# Patient Record
Sex: Female | Born: 1995 | Hispanic: Yes | Marital: Married | Smoking: Never smoker
Health system: Southern US, Community
[De-identification: ages and names within clinical notes are randomized; demographics above are authoritative.]

## PROBLEM LIST (undated history)

## (undated) DIAGNOSIS — Z789 Other specified health status: Secondary | ICD-10-CM

## (undated) DIAGNOSIS — K4090 Unilateral inguinal hernia, without obstruction or gangrene, not specified as recurrent: Secondary | ICD-10-CM

## (undated) HISTORY — DX: Other specified health status: Z78.9

## (undated) HISTORY — PX: NO PAST SURGERIES: SHX2092

---

## 2019-10-22 NOTE — L&D Delivery Note (Addendum)
Delivery Note CNM asked to come to BS due to imminent delivery. Baby well-dated by first trimester ultrasound. 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 At 2:29 PM a severely preterm infant was delivered via Vaginal, Spontaneous (Presentation: Double footling breech). Slow heart rate present at delivery. APGAR: 1, 1; weight pending.   Placenta status: Spontaneous, no trailing membranes, in very poor condition. Sent Pathology. Cord: 3 vessels with the following complications: none.  Cord pH: NA  Anesthesia: None Episiotomy: None Lacerations: None Suture Repair: NA Est. Blood Loss (mL): 151 ml   Mom to [facility].  Baby to stay in room for now. Dr. Debroah Loop notified of delivery.  FOB requesting return of Neonatologist who had stepped into the room as baby was delivering and left due to know previability. Dr. Theresa Mulligan called and came back to talk to family. FOB expressed gratitude for care and explanations of why baby was not a candidate for resuscitation.  Dorathy Kinsman 03/24/2020, 3:09 PM

## 2020-01-27 ENCOUNTER — Inpatient Hospital Stay (HOSPITAL_COMMUNITY)
Admission: EM | Admit: 2020-01-27 | Discharge: 2020-01-27 | Disposition: A | Discharge status: Home / Self Care | Payer: Medicaid Other | Attending: Obstetrics and Gynecology | Admitting: Obstetrics and Gynecology

## 2020-01-27 ENCOUNTER — Other Ambulatory Visit: Payer: Self-pay

## 2020-01-27 ENCOUNTER — Ambulatory Visit (HOSPITAL_COMMUNITY)
Admission: EM | Admit: 2020-01-27 | Discharge: 2020-01-27 | Disposition: A | Discharge status: Home / Self Care | Payer: Self-pay | Attending: Family Medicine | Admitting: Family Medicine

## 2020-01-27 ENCOUNTER — Inpatient Hospital Stay (HOSPITAL_COMMUNITY): Payer: Medicaid Other

## 2020-01-27 ENCOUNTER — Encounter (HOSPITAL_COMMUNITY): Payer: Self-pay

## 2020-01-27 DIAGNOSIS — Z3A13 13 weeks gestation of pregnancy: Secondary | ICD-10-CM | POA: Diagnosis not present

## 2020-01-27 DIAGNOSIS — O200 Threatened abortion: Secondary | ICD-10-CM | POA: Insufficient documentation

## 2020-01-27 DIAGNOSIS — O3680X0 Pregnancy with inconclusive fetal viability, not applicable or unspecified: Secondary | ICD-10-CM

## 2020-01-27 LAB — URINALYSIS, ROUTINE W REFLEX MICROSCOPIC
Bilirubin Urine: NEGATIVE
Glucose, UA: NEGATIVE mg/dL
Hgb urine dipstick: NEGATIVE
Ketones, ur: NEGATIVE mg/dL
Leukocytes,Ua: NEGATIVE
Nitrite: NEGATIVE
Protein, ur: NEGATIVE mg/dL
Specific Gravity, Urine: 1.015 (ref 1.005–1.030)
pH: 7 (ref 5.0–8.0)

## 2020-01-27 LAB — I-STAT BETA HCG BLOOD, ED (MC, WL, AP ONLY): I-stat hCG, quantitative: >2000 m[IU]/mL — ABNORMAL HIGH (ref <5)

## 2020-01-27 LAB — WET PREP, GENITAL
Clue Cells Wet Prep HPF POC: NONE SEEN
Sperm: NONE SEEN
Trich, Wet Prep: NONE SEEN
Yeast Wet Prep HPF POC: NONE SEEN

## 2020-01-27 LAB — POC URINE PREG, ED: Preg Test, Ur: NEGATIVE

## 2020-01-27 NOTE — MAU Provider Note (Signed)
Chief Complaint: Rupture of Membranes   None     SUBJECTIVE HPI: Latoya Taylor is a 24 y.o. G1P0 at [redacted]w[redacted]d by LMP who presents to maternity admissions reporting gush of clear fluid this morning at 5 am following by a light trickle of fluid since then. There is no pain, no bleeding, no fever/chills.  There is no other discharge or itching/burning/odor.  She has not tried any treatments.   HPI  History reviewed. No pertinent past medical history. History reviewed. No pertinent surgical history. Social History   Socioeconomic History  . Marital status: Married    Spouse name: Not on file  . Number of children: Not on file  . Years of education: Not on file  . Highest education level: Not on file  Occupational History  . Not on file  Tobacco Use  . Smoking status: Never Smoker  Substance and Sexual Activity  . Alcohol use: Never  . Drug use: Never  . Sexual activity: Yes  Other Topics Concern  . Not on file  Social History Narrative  . Not on file   Social Determinants of Health   Financial Resource Strain:   . Difficulty of Paying Living Expenses:   Food Insecurity:   . Worried About Charity fundraiser in the Last Year:   . Arboriculturist in the Last Year:   Transportation Needs:   . Film/video editor (Medical):   Marland Kitchen Lack of Transportation (Non-Medical):   Physical Activity:   . Days of Exercise per Week:   . Minutes of Exercise per Session:   Stress:   . Feeling of Stress :   Social Connections:   . Frequency of Communication with Friends and Family:   . Frequency of Social Gatherings with Friends and Family:   . Attends Religious Services:   . Active Member of Clubs or Organizations:   . Attends Archivist Meetings:   Marland Kitchen Marital Status:   Intimate Partner Violence:   . Fear of Current or Ex-Partner:   . Emotionally Abused:   Marland Kitchen Physically Abused:   . Sexually Abused:    No current facility-administered medications on file prior to encounter.    Current Outpatient Medications on File Prior to Encounter  Medication Sig Dispense Refill  . Prenatal Vit-Fe Fumarate-FA (PRENATAL MULTIVITAMIN) TABS tablet Take 1 tablet by mouth daily at 12 noon.     No Known Allergies  ROS:  Review of Systems  Constitutional: Negative for chills and fever.  Respiratory: Negative for cough and shortness of breath.   Cardiovascular: Negative for chest pain.  Gastrointestinal: Negative for nausea and vomiting.  Genitourinary: Positive for vaginal discharge. Negative for dysuria, frequency and urgency.  Musculoskeletal: Negative.   Neurological: Negative for dizziness and headaches.     I have reviewed patient's Past Medical Hx, Surgical Hx, Family Hx, Social Hx, medications and allergies.   Physical Exam   Patient Vitals for the past 24 hrs:  BP Temp Temp src Pulse Resp SpO2  01/27/20 1415 127/81 -- -- 98 17 --  01/27/20 1024 132/86 99 F (37.2 C) Oral (!) 101 18 --  01/27/20 0910 125/80 (!) 97.4 F (36.3 C) Oral 90 20 99 %   Constitutional: Well-developed, well-nourished female in no acute distress.  Cardiovascular: normal rate Respiratory: normal effort GI: Abd soft, non-tender. Pos BS x 4 MS: Extremities nontender, no edema, normal ROM Neurologic: Alert and oriented x 4.  GU: Neg CVAT.  PELVIC EXAM: Cervix pink, visually  closed, mildly friable with ectropion, small amount thin watery discharge, vaginal walls and external genitalia normal  FHT 180 by doppler  LAB RESULTS Results for orders placed or performed during the hospital encounter of 01/27/20 (from the past 24 hour(s))  I-Stat beta hCG blood, ED     Status: Abnormal   Collection Time: 01/27/20  9:35 AM  Result Value Ref Range   I-stat hCG, quantitative >2,000.0 (H) <5 mIU/mL   Comment 3          Urinalysis, Routine w reflex microscopic     Status: None   Collection Time: 01/27/20 10:50 AM  Result Value Ref Range   Color, Urine YELLOW YELLOW   APPearance CLEAR CLEAR    Specific Gravity, Urine 1.015 1.005 - 1.030   pH 7.0 5.0 - 8.0   Glucose, UA NEGATIVE NEGATIVE mg/dL   Hgb urine dipstick NEGATIVE NEGATIVE   Bilirubin Urine NEGATIVE NEGATIVE   Ketones, ur NEGATIVE NEGATIVE mg/dL   Protein, ur NEGATIVE NEGATIVE mg/dL   Nitrite NEGATIVE NEGATIVE   Leukocytes,Ua NEGATIVE NEGATIVE  Wet prep, genital     Status: Abnormal   Collection Time: 01/27/20 11:48 AM   Specimen: Vaginal; Genital  Result Value Ref Range   Yeast Wet Prep HPF POC NONE SEEN NONE SEEN   Trich, Wet Prep NONE SEEN NONE SEEN   Clue Cells Wet Prep HPF POC NONE SEEN NONE SEEN   WBC, Wet Prep HPF POC MANY (A) NONE SEEN   Sperm NONE SEEN        IMAGING US OB LESS THAN 14 WEEKS WITH OB TRANSVAGINAL  Result Date: 01/27/2020 CLINICAL DATA:  Leaking fluid. Evaluate viability. Quantitative beta HCG level greater than 2000. Patient 13 weeks and 6 days pregnant based on her last menstrual period. EXAM: OBSTETRIC <14 WK ULTRASOUND TECHNIQUE: Transabdominal ultrasound was performed for evaluation of the gestation as well as the maternal uterus and adnexal regions. COMPARISON:  None. FINDINGS: Intrauterine gestational sac: Single, but not well-defined due to adversely no amniotic fluid surround embryo. Yolk sac:  Not Visualized. Embryo:  Visualized. Cardiac Activity: Visualized. Heart Rate: 159 bpm CRL:   49.57 mm   11 w 5 d                  [place] EDC: 08/12/2020 Subchorionic hemorrhage:  None visualized. Maternal uterus/adnexae: Virtually no amniotic fluid detected surrounding the embryo. No uterine masses. Cervix is unremarkable. No adnexal mass.  Neither ovary visualized. IMPRESSION: 1. Marked paucity of amniotic fluid, with virtually no fluid seen surrounding the embryo. 2. Single live intrauterine pregnancy with a measured gestational age of [redacted] weeks and 5 days. Electronically Signed   By: Amie Portland M.D.   On: 01/27/2020 12:34    MAU Management/MDM: Orders Placed This Encounter  Procedures  .  Wet prep, genital  . US OB LESS THAN 14 WEEKS WITH OB TRANSVAGINAL  . [place] MFM Fetal Nuchal Translucency  . Urinalysis, Routine w reflex microscopic  . AMB referral to maternal fetal medicine  . I-Stat beta hCG blood, ED  . Fern Test  . Discharge patient    No orders of the defined types were placed in this encounter.   SSE with watery fluid but not enough for pooling.  Fern slide negative. [place] results today indicate low fluid, making PPROM at early gestational age likely.  Discussed results with pt, poor prognosis, pt will likely miscarry.  Answered pt and s/o questions today.  Consult Dr Vergie Living about follow up  plan.  Pt to MFM for NT ultrasound and consult tomorrow at 7:30 am for diagnosis and management plan.  Pt discharged with strict return precautions.  ASSESSMENT 1. Threatened miscarriage in early pregnancy   2. Pregnancy with uncertain fetal viability     PLAN Discharge home Allergies as of 01/27/2020   No Known Allergies     Medication List    TAKE these medications   prenatal multivitamin Tabs tablet Take 1 tablet by mouth daily at 12 noon.      Follow-up Information    [facility] Follow up.   Specialty: Maternal and Fetal Medicine Why: Ultrasound and counseling at 7:30 am tomorrow, 01/28/20.   Contact information: [address], [address] [id number] mc [place] [address] [phone]       [facility] Follow up.   Specialty: Obstetrics and Gynecology Why: Return to MAU as needed for emergencies. Contact information: [address] [id number] mc [place] [address] [phone]          Sharen Counter Certified Nurse-Midwife 01/27/2020  2:32 PM

## 2020-01-27 NOTE — MAU Note (Signed)
Patient woke up from sleep at 0500 with clear fluid leaking from her vagina. Then got up to use the bathroom and voided, it was still "leaking."  NO pain. No leaking now.

## 2020-01-27 NOTE — ED Provider Notes (Signed)
MSE was initiated and I personally evaluated the patient and placed orders (if any) at  9:18 AM on January 27, 2020.   24 year old female presents with complaint of leaking clear fluid- described as a gush while going to the bathroom, did not smell like urine, not currently needing a pad.  Patient reports she is [redacted] weeks pregnant, LMP October 22, 2019, [facility] to [facility] for positive pregnancy test and ultrasound 2 weeks ago.  Symptoms started this morning around 5 AM, reports mild abdominal cramping, none at this time, denies vaginal bleeding.  Patient is G1, P0. Denies abdominal pain, on exam, has mild generalized discomfort, states she just wasn't expecting deep palpation.   + quant, >2000 Discussed with Santina Evans, MAU APP, send to MAU for further evaluation.  The patient appears stable so that the remainder of the MSE may be completed by another provider.   Jeannie Fend, PA-C 01/27/20 1101    Rolan Bucco, MD 01/27/20 1323

## 2020-01-27 NOTE — ED Triage Notes (Signed)
Patient complains of vaginal discharge that she describes as fluid leaking. Reports positive pregnancy 1/1 and complains of general lower abdominal pain with same. G1

## 2020-01-27 NOTE — ED Notes (Signed)
Pt decided she wanted to go to womens since she could not get an ultrasound and OB care in urgent care.

## 2020-01-28 ENCOUNTER — Ambulatory Visit (HOSPITAL_COMMUNITY): Payer: Medicaid Other

## 2020-01-28 LAB — GC/CHLAMYDIA PROBE AMP (~~LOC~~) NOT AT ARMC
Chlamydia: NEGATIVE
Comment: NEGATIVE
Comment: NORMAL
Neisseria Gonorrhea: NEGATIVE

## 2020-02-03 ENCOUNTER — Other Ambulatory Visit (HOSPITAL_COMMUNITY): Payer: Self-pay | Admitting: *Deleted

## 2020-02-03 DIAGNOSIS — O3680X0 Pregnancy with inconclusive fetal viability, not applicable or unspecified: Secondary | ICD-10-CM

## 2020-02-04 ENCOUNTER — Encounter (HOSPITAL_COMMUNITY): Payer: Self-pay

## 2020-02-04 ENCOUNTER — Ambulatory Visit (HOSPITAL_COMMUNITY): Payer: Medicaid Other | Admitting: *Deleted

## 2020-02-04 ENCOUNTER — Ambulatory Visit (HOSPITAL_COMMUNITY)
Admission: RE | Admit: 2020-02-04 | Discharge: 2020-02-04 | Disposition: A | Discharge status: Home / Self Care | Payer: Medicaid Other | Source: Ambulatory Visit | Attending: Obstetrics and Gynecology | Admitting: Obstetrics and Gynecology

## 2020-02-04 ENCOUNTER — Other Ambulatory Visit: Payer: Self-pay

## 2020-02-04 VITALS — BP 129/92 | HR 117 | Temp 97.8°F | Ht <= 58 in

## 2020-02-04 DIAGNOSIS — Z3A15 15 weeks gestation of pregnancy: Secondary | ICD-10-CM

## 2020-02-04 DIAGNOSIS — O3680X0 Pregnancy with inconclusive fetal viability, not applicable or unspecified: Secondary | ICD-10-CM | POA: Diagnosis present

## 2020-02-07 ENCOUNTER — Other Ambulatory Visit (HOSPITAL_COMMUNITY): Payer: Self-pay | Admitting: *Deleted

## 2020-02-07 ENCOUNTER — Encounter: Payer: Self-pay | Admitting: Advanced Practice Midwife

## 2020-02-07 DIAGNOSIS — Z8759 Personal history of other complications of pregnancy, childbirth and the puerperium: Secondary | ICD-10-CM

## 2020-02-23 ENCOUNTER — Ambulatory Visit: Payer: Medicaid Other | Admitting: *Deleted

## 2020-02-23 ENCOUNTER — Ambulatory Visit (HOSPITAL_COMMUNITY): Payer: Medicaid Other | Attending: Obstetrics and Gynecology

## 2020-02-23 ENCOUNTER — Encounter: Payer: Self-pay | Admitting: *Deleted

## 2020-02-23 ENCOUNTER — Other Ambulatory Visit: Payer: Self-pay

## 2020-02-23 VITALS — BP 131/91 | HR 112 | Temp 97.7°F

## 2020-02-23 DIAGNOSIS — Z8759 Personal history of other complications of pregnancy, childbirth and the puerperium: Secondary | ICD-10-CM | POA: Diagnosis not present

## 2020-02-23 DIAGNOSIS — O321XX0 Maternal care for breech presentation, not applicable or unspecified: Secondary | ICD-10-CM

## 2020-02-23 DIAGNOSIS — O4292 Full-term premature rupture of membranes, unspecified as to length of time between rupture and onset of labor: Secondary | ICD-10-CM | POA: Diagnosis not present

## 2020-02-23 DIAGNOSIS — Z3A17 17 weeks gestation of pregnancy: Secondary | ICD-10-CM

## 2020-02-23 NOTE — Progress Notes (Signed)
States she is still leaking a small amount at intervals. Definitely not as much as initially. States she is feeling some pressure in her suprapubic area like the baby is pressing down. Kind of like when she is about to start her period.

## 2020-02-29 ENCOUNTER — Other Ambulatory Visit: Payer: Self-pay | Admitting: *Deleted

## 2020-02-29 DIAGNOSIS — O4102X0 Oligohydramnios, second trimester, not applicable or unspecified: Secondary | ICD-10-CM

## 2020-03-02 ENCOUNTER — Ambulatory Visit (INDEPENDENT_AMBULATORY_CARE_PROVIDER_SITE_OTHER): Payer: Medicaid Other | Admitting: Family Medicine

## 2020-03-02 ENCOUNTER — Other Ambulatory Visit: Payer: Self-pay

## 2020-03-02 ENCOUNTER — Encounter: Payer: Self-pay | Admitting: Family Medicine

## 2020-03-02 ENCOUNTER — Other Ambulatory Visit (HOSPITAL_COMMUNITY)
Admission: RE | Admit: 2020-03-02 | Discharge: 2020-03-02 | Disposition: A | Discharge status: Home / Self Care | Payer: Medicaid Other | Source: Ambulatory Visit | Attending: Family Medicine | Admitting: Family Medicine

## 2020-03-02 DIAGNOSIS — O0990 Supervision of high risk pregnancy, unspecified, unspecified trimester: Secondary | ICD-10-CM | POA: Insufficient documentation

## 2020-03-02 DIAGNOSIS — O42912 Preterm premature rupture of membranes, unspecified as to length of time between rupture and onset of labor, second trimester: Secondary | ICD-10-CM

## 2020-03-02 DIAGNOSIS — Z3A18 18 weeks gestation of pregnancy: Secondary | ICD-10-CM

## 2020-03-02 DIAGNOSIS — O0992 Supervision of high risk pregnancy, unspecified, second trimester: Secondary | ICD-10-CM

## 2020-03-02 DIAGNOSIS — O10019 Pre-existing essential hypertension complicating pregnancy, unspecified trimester: Secondary | ICD-10-CM

## 2020-03-02 DIAGNOSIS — O42919 Preterm premature rupture of membranes, unspecified as to length of time between rupture and onset of labor, unspecified trimester: Secondary | ICD-10-CM

## 2020-03-02 DIAGNOSIS — O10012 Pre-existing essential hypertension complicating pregnancy, second trimester: Secondary | ICD-10-CM

## 2020-03-02 LAB — POCT URINALYSIS DIP (DEVICE)
Bilirubin Urine: NEGATIVE
Glucose, UA: NEGATIVE mg/dL
Hgb urine dipstick: NEGATIVE
Ketones, ur: NEGATIVE mg/dL
Leukocytes,Ua: NEGATIVE
Nitrite: NEGATIVE
Protein, ur: NEGATIVE mg/dL
Specific Gravity, Urine: >=1.03 (ref 1.005–1.030)
Urobilinogen, UA: 0.2 mg/dL (ref 0.0–1.0)
pH: 6.5 (ref 5.0–8.0)

## 2020-03-02 NOTE — Progress Notes (Signed)
Subjective:   Latoya Taylor is a 24 y.o. G1P0 at [redacted]w[redacted]d by early ultrasound being seen today for her first obstetrical visit.  Her obstetrical history is significant for previable PPROM. Patient reports no complaints.  HISTORY: OB History  Gravida Para Term Preterm AB Living  1 0 0 0 0 0  SAB TAB Ectopic Multiple Live Births  0 0 0 0 0    # Outcome Date GA Lbr Len/2nd Weight Sex Delivery Anes PTL Lv  1 Current             Past Medical History:  Diagnosis Date  . Medical history non-contributory    Past Surgical History:  Procedure Laterality Date  . NO PAST SURGERIES     Family History  Problem Relation Age of Onset  . Hypertension Mother   . Hypertension Father   . Hypertension Maternal Grandmother   . Diabetes type II Maternal Grandmother    Social History   Tobacco Use  . Smoking status: Never Smoker  . Smokeless tobacco: Never Used  Substance Use Topics  . Alcohol use: Never  . Drug use: Never   No Known Allergies Current Outpatient Medications on File Prior to Visit  Medication Sig Dispense Refill  . Prenatal Vit-Fe Fumarate-FA (PRENATAL MULTIVITAMIN) TABS tablet Take 1 tablet by mouth daily at 12 noon.     No current facility-administered medications on file prior to visit.     Exam   Vitals:   03/02/20 1524  BP: (!) 135/99  Pulse: (!) 114   Fetal Heart Rate (bpm): 158  Uterus:   18 wk size  Pelvic Exam: Perineum: no hemorrhoids, normal perineum   Vulva: normal external genitalia, no lesions   Vagina:  normal mucosa, normal discharge, no pooling   Cervix: no lesions and normal, pap smear done.    Adnexa: normal adnexa and no mass, fullness, tenderness   Bony Pelvis: average  System: General: well-developed, well-nourished female in no acute distress   Skin: normal coloration and turgor, no rashes   Neurologic: oriented, normal, negative, normal mood   Extremities: normal strength, tone, and muscle mass, ROM of all joints is normal   HEENT Extraocular movement intact and sclera clear, anicteric   Mouth/Teeth mucous membranes moist, pharynx normal without lesions and dental hygiene good   Neck supple and no masses   Cardiovascular: regular rate and rhythm   Respiratory:  no respiratory distress, normal breath sounds   Abdomen: soft, non-tender; bowel sounds normal; no masses,  no organomegaly     Assessment:   Pregnancy: G1P0 Patient Active Problem List   Diagnosis Date Noted  . Supervision of high risk pregnancy, antepartum 03/02/2020     Plan:  1. Supervision of high risk pregnancy, antepartum First visit, still leaking some clear fluid, anhydramnios on u/s Advised of risks of Previable PPROM in the second trimester is associated with risks such as chorioamnionitis, placental abruption, umbilical cord prolapse, maternal distress due to overwhelming infection; and these may constitute indications for delivery prior to viability. Should she make it to viability, risks associated with pre-viable PPROM include classical C-section and fetal sepsis and distress. Chances of pulmonary hypoplasia and risks of pre-maturity including major handicaps and Cerebral Palsy were also discussed.  She is not in labor and will be sent home on pelvic rest, daily temps and plan for admission when she reaches viability or becomes infected.  - POCT urinalysis dip (device) - Culture, OB Urine - [facility] AMB BABYSCRIPTS SCHEDULE  OPTIMIZATION - Genetic Screening - Obstetric Panel, Including HIV - Cytology - PAP( [place]) - Protein / creatinine ratio, urine - Comprehensive metabolic panel - Hepatitis C Antibody   Initial labs drawn. Continue prenatal vitamins. Genetic Screening discussed, NIPS: ordered. Ultrasound discussed; fetal anatomic survey: results reviewed. Problem list reviewed and updated. The nature of [facility] with multiple MDs and other Advanced Practice Providers was explained to  patient; also emphasized that residents, students are part of our team. Routine obstetric precautions reviewed. Return in 3 weeks (on 03/23/2020).

## 2020-03-02 NOTE — Patient Instructions (Addendum)
[facility] [employer]  Department of [employer] [place] [address], [place], [place] [address] [phone]   or  [place]human[place]services/social-services **SNAP/EBT/ Other nutritional benefits  [place] DHHS-Public Health-WIC [address], [place], [place] [address] [phone]  or  https://[place].gov[place]human-services/health-department **WIC for  women who are pregnant and postpartum, infants and children up to 5 years old  [employer] [employer] [facility] [address], [place], [place] [address] [phone]   or   www.theblessedtable.org  **Food pantry  [employer] [facility] [address], [place], [place] [address] [phone]   or   https://[facility]kol[facility].godaddysites.com  **Emergency food and prepared meals  [facility] [employer] [facility] [address], [place], [place] [address] [phone]   or   www.cedargrovetop.us **Food pantry  [facility] [employer] [facility] [address], [place], [place] [address] [phone]   or   www.facebook.com/[facility]-116430931718202 **Food pantry  [facility] [address], [place], [place] [address] [phone] **Food pantry  [facility] [address], [place], [place] [address] [phone]) [phone]   or   www.greensborourbanministry.org  **Food pantry and prepared meals  [facility] [address], [address], [place], [place] [address] https://jfsg[place].[email or web address]/  **Food pantry  [facility] [facility] [address], [place], [place] [address] [phone]   or   www.lbcnow.org  **Food pantry  [employer] [address], [place], [place] [address] [phone]   or   http://www.onestepfurther.com **Food pantry, nutrition education, gardening activities  [employer] [facility] [address], [place], [place] [address] [phone] **Food pantry  [facility] [address], [place], [place] [address] [phone]   or   www.salvationarmyof[place].org **Food pantry  [facility] [place] [address], [place], [place] [address] [phone]   or   http://senior-resources[place].org **Meals on Wheels Program  [facility] [employer] [facility] [address], [place], [place] [address] [phone]   or   www.stmattchurch.com  **Food pantry  [facility] [employer] [facility] [address], , [place] [address] [phone]   or   [facility].org **Food pantry     

## 2020-03-03 ENCOUNTER — Telehealth (INDEPENDENT_AMBULATORY_CARE_PROVIDER_SITE_OTHER): Payer: Medicaid Other

## 2020-03-03 DIAGNOSIS — O42919 Preterm premature rupture of membranes, unspecified as to length of time between rupture and onset of labor, unspecified trimester: Secondary | ICD-10-CM | POA: Insufficient documentation

## 2020-03-03 DIAGNOSIS — O0990 Supervision of high risk pregnancy, unspecified, unspecified trimester: Secondary | ICD-10-CM

## 2020-03-03 DIAGNOSIS — O169 Unspecified maternal hypertension, unspecified trimester: Secondary | ICD-10-CM | POA: Insufficient documentation

## 2020-03-03 LAB — COMPREHENSIVE METABOLIC PANEL
ALT: 13 IU/L (ref 0–32)
AST: 17 IU/L (ref 0–40)
Albumin/Globulin Ratio: 1.3 (ref 1.2–2.2)
Albumin: 4 g/dL (ref 3.9–5.0)
Alkaline Phosphatase: 60 IU/L (ref 39–117)
BUN/Creatinine Ratio: 14 (ref 9–23)
BUN: 6 mg/dL (ref 6–20)
Bilirubin Total: 0.7 mg/dL (ref 0.0–1.2)
CO2: 22 mmol/L (ref 20–29)
Calcium: 9.5 mg/dL (ref 8.7–10.2)
Chloride: 105 mmol/L (ref 96–106)
Creatinine, Ser: 0.43 mg/dL — ABNORMAL LOW (ref 0.57–1.00)
GFR calc Af Amer: 162 mL/min/{1.73_m2} (ref >59)
GFR calc non Af Amer: 141 mL/min/{1.73_m2} (ref >59)
Globulin, Total: 3 g/dL (ref 1.5–4.5)
Glucose: 77 mg/dL (ref 65–99)
Potassium: 3.7 mmol/L (ref 3.5–5.2)
Sodium: 137 mmol/L (ref 134–144)
Total Protein: 7 g/dL (ref 6.0–8.5)

## 2020-03-03 LAB — OBSTETRIC PANEL, INCLUDING HIV
Antibody Screen: NEGATIVE
Basophils Absolute: 0.1 10*3/uL (ref 0.0–0.2)
Basos: 1 %
EOS (ABSOLUTE): 0.3 10*3/uL (ref 0.0–0.4)
Eos: 2 %
HIV Screen 4th Generation wRfx: NONREACTIVE
Hematocrit: 45.4 % (ref 34.0–46.6)
Hemoglobin: 15.5 g/dL (ref 11.1–15.9)
Hepatitis B Surface Ag: NEGATIVE
Immature Grans (Abs): 0.2 10*3/uL — ABNORMAL HIGH (ref 0.0–0.1)
Immature Granulocytes: 1 %
Lymphocytes Absolute: 1.5 10*3/uL (ref 0.7–3.1)
Lymphs: 10 %
MCH: 32.8 pg (ref 26.6–33.0)
MCHC: 34.1 g/dL (ref 31.5–35.7)
MCV: 96 fL (ref 79–97)
Monocytes Absolute: 1.2 10*3/uL — ABNORMAL HIGH (ref 0.1–0.9)
Monocytes: 8 %
Neutrophils Absolute: 12.4 10*3/uL — ABNORMAL HIGH (ref 1.4–7.0)
Neutrophils: 78 %
Platelets: 265 10*3/uL (ref 150–450)
RBC: 4.72 x10E6/uL (ref 3.77–5.28)
RDW: 12.4 % (ref 11.7–15.4)
RPR Ser Ql: NONREACTIVE
Rh Factor: POSITIVE
Rubella Antibodies, IGG: 1.92 index (ref >0.99)
WBC: 15.7 10*3/uL — ABNORMAL HIGH (ref 3.4–10.8)

## 2020-03-03 LAB — PROTEIN / CREATININE RATIO, URINE
Creatinine, Urine: 166.3 mg/dL
Protein, Ur: 19.4 mg/dL
Protein/Creat Ratio: 117 mg/g creat (ref 0–200)

## 2020-03-03 LAB — HEPATITIS C ANTIBODY: Hep C Virus Ab: <0.1 s/co ratio (ref 0.0–0.9)

## 2020-03-03 MED ORDER — ASPIRIN EC 81 MG PO TBEC: 81.0000 mg | DELAYED_RELEASE_TABLET | Freq: Every day | ORAL | 3 refills | Status: DC

## 2020-03-03 NOTE — Addendum Note (Signed)
Addended by: Reva Bores on: 03/03/2020 02:57 PM   Modules accepted: Orders

## 2020-03-03 NOTE — Telephone Encounter (Signed)
Weight not documented from yesterday's encounter. Called pt for weight. Weight at office yesterday was 149.5 lbs. Pt reports yesterday right after leaving office following pelvic exam with PAP smear she experienced a gush of fluid. States she has had clear discharge with pink tinge but no further watery fluid. Reviewed with Anyanwu, MD who states pt should return to MAU for any further fluid leaking or symptoms of infection. Provider recommendation reviewed with pt. Pt has no other concerns at this time.

## 2020-03-04 LAB — URINE CULTURE, OB REFLEX

## 2020-03-04 LAB — CULTURE, OB URINE

## 2020-03-06 LAB — CYTOLOGY - PAP: Diagnosis: NEGATIVE

## 2020-03-22 ENCOUNTER — Encounter: Payer: Medicaid Other | Admitting: Obstetrics & Gynecology

## 2020-03-22 ENCOUNTER — Ambulatory Visit: Payer: Medicaid Other | Admitting: *Deleted

## 2020-03-22 ENCOUNTER — Encounter (HOSPITAL_COMMUNITY): Payer: Self-pay | Admitting: Obstetrics and Gynecology

## 2020-03-22 ENCOUNTER — Ambulatory Visit: Payer: Medicaid Other | Attending: Obstetrics and Gynecology

## 2020-03-22 ENCOUNTER — Inpatient Hospital Stay (HOSPITAL_COMMUNITY)
Admission: AD | Admit: 2020-03-22 | Discharge: 2020-03-22 | Disposition: A | Discharge status: Home / Self Care | Payer: Medicaid Other | Attending: Obstetrics and Gynecology | Admitting: Obstetrics and Gynecology

## 2020-03-22 ENCOUNTER — Encounter: Payer: Self-pay | Admitting: *Deleted

## 2020-03-22 ENCOUNTER — Other Ambulatory Visit: Payer: Self-pay

## 2020-03-22 DIAGNOSIS — O99212 Obesity complicating pregnancy, second trimester: Secondary | ICD-10-CM | POA: Diagnosis not present

## 2020-03-22 DIAGNOSIS — R109 Unspecified abdominal pain: Secondary | ICD-10-CM | POA: Diagnosis not present

## 2020-03-22 DIAGNOSIS — O0992 Supervision of high risk pregnancy, unspecified, second trimester: Secondary | ICD-10-CM | POA: Diagnosis not present

## 2020-03-22 DIAGNOSIS — O10019 Pre-existing essential hypertension complicating pregnancy, unspecified trimester: Secondary | ICD-10-CM | POA: Insufficient documentation

## 2020-03-22 DIAGNOSIS — Z3A21 21 weeks gestation of pregnancy: Secondary | ICD-10-CM

## 2020-03-22 DIAGNOSIS — O10012 Pre-existing essential hypertension complicating pregnancy, second trimester: Secondary | ICD-10-CM | POA: Insufficient documentation

## 2020-03-22 DIAGNOSIS — R102 Pelvic and perineal pain: Secondary | ICD-10-CM | POA: Diagnosis not present

## 2020-03-22 DIAGNOSIS — O42912 Preterm premature rupture of membranes, unspecified as to length of time between rupture and onset of labor, second trimester: Secondary | ICD-10-CM

## 2020-03-22 DIAGNOSIS — Z363 Encounter for antenatal screening for malformations: Secondary | ICD-10-CM

## 2020-03-22 DIAGNOSIS — O0990 Supervision of high risk pregnancy, unspecified, unspecified trimester: Secondary | ICD-10-CM

## 2020-03-22 DIAGNOSIS — O4102X0 Oligohydramnios, second trimester, not applicable or unspecified: Secondary | ICD-10-CM | POA: Insufficient documentation

## 2020-03-22 DIAGNOSIS — O42919 Preterm premature rupture of membranes, unspecified as to length of time between rupture and onset of labor, unspecified trimester: Secondary | ICD-10-CM | POA: Insufficient documentation

## 2020-03-22 DIAGNOSIS — O26892 Other specified pregnancy related conditions, second trimester: Secondary | ICD-10-CM | POA: Diagnosis present

## 2020-03-22 DIAGNOSIS — Z7982 Long term (current) use of aspirin: Secondary | ICD-10-CM | POA: Insufficient documentation

## 2020-03-22 DIAGNOSIS — E669 Obesity, unspecified: Secondary | ICD-10-CM

## 2020-03-22 LAB — URINALYSIS, ROUTINE W REFLEX MICROSCOPIC
Bilirubin Urine: NEGATIVE
Glucose, UA: NEGATIVE mg/dL
Hgb urine dipstick: NEGATIVE
Ketones, ur: NEGATIVE mg/dL
Leukocytes,Ua: NEGATIVE
Nitrite: NEGATIVE
Protein, ur: NEGATIVE mg/dL
Specific Gravity, Urine: 1.012 (ref 1.005–1.030)
pH: 7 (ref 5.0–8.0)

## 2020-03-22 LAB — CBC WITH DIFFERENTIAL/PLATELET
Abs Immature Granulocytes: 0.2 10*3/uL — ABNORMAL HIGH (ref 0.00–0.07)
Basophils Absolute: 0.1 10*3/uL (ref 0.0–0.1)
Basophils Relative: 0 %
Eosinophils Absolute: 0.3 10*3/uL (ref 0.0–0.5)
Eosinophils Relative: 2 %
HCT: 40.7 % (ref 36.0–46.0)
Hemoglobin: 14.3 g/dL (ref 12.0–15.0)
Immature Granulocytes: 1 %
Lymphocytes Relative: 10 %
Lymphs Abs: 1.5 10*3/uL (ref 0.7–4.0)
MCH: 33.1 pg (ref 26.0–34.0)
MCHC: 35.1 g/dL (ref 30.0–36.0)
MCV: 94.2 fL (ref 80.0–100.0)
Monocytes Absolute: 1.1 10*3/uL — ABNORMAL HIGH (ref 0.1–1.0)
Monocytes Relative: 7 %
Neutro Abs: 11.8 10*3/uL — ABNORMAL HIGH (ref 1.7–7.7)
Neutrophils Relative %: 80 %
Platelets: 237 10*3/uL (ref 150–400)
RBC: 4.32 MIL/uL (ref 3.87–5.11)
RDW: 11.9 % (ref 11.5–15.5)
WBC: 15.1 10*3/uL — ABNORMAL HIGH (ref 4.0–10.5)
nRBC: 0 % (ref 0.0–0.2)

## 2020-03-22 NOTE — MAU Provider Note (Signed)
Chief Complaint:  Abdominal Pain   First Provider Initiated Contact with Patient 03/22/20 1704     HPI: Latoya Taylor is a 24 y.o. G1P0 at 1337w5d with known PPROM since first trimester, who presents to maternity admissions reporting abdominal pain.  She was having an US today and noted to be tender over RLQ and umbilicus.  Was sent here for evaluation. . She denies vaginal bleeding, vaginal itching/burning, urinary symptoms, h/a, dizziness, n/v, diarrhea, constipation or fever/chills.   Abdominal Pain This is a new problem. The current episode started today. The onset quality is gradual. The problem occurs constantly. The problem has been unchanged. The pain is located in the RLQ and periumbilical region. Pain scale: States only hurts when abdomen is pressed  The quality of the pain is aching and cramping. The abdominal pain does not radiate. Pertinent negatives include no anorexia, constipation, diarrhea, dysuria, fever, frequency, myalgias, nausea or vomiting. The pain is aggravated by palpation. She has tried nothing for the symptoms.   RN Note: Pt sent from MFM for possible infection.( Decreased fluidv s SRom) since 9 weeks. Pt has some tenderness on right plevis when the doctor pressed on the area.  Past Medical History: Past Medical History:  Diagnosis Date  . Medical history non-contributory     Past obstetric history: OB History  Gravida Para Term Preterm AB Living  1         0  SAB TAB Ectopic Multiple Live Births               # Outcome Date GA Lbr Len/2nd Weight Sex Delivery Anes PTL Lv  1 Current             Past Surgical History: Past Surgical History:  Procedure Laterality Date  . NO PAST SURGERIES      Family History: Family History  Problem Relation Age of Onset  . Hypertension Mother   . Hypertension Father   . Hypertension Maternal Grandmother   . Diabetes type II Maternal Grandmother     Social History: Social History   Tobacco Use  . Smoking  status: Never Smoker  . Smokeless tobacco: Never Used  Substance Use Topics  . Alcohol use: Never  . Drug use: Never    Allergies: No Known Allergies  Meds:  Medications Prior to Admission  Medication Sig Dispense Refill Last Dose  . aspirin EC 81 MG tablet Take 1 tablet (81 mg total) by mouth daily. 90 tablet 3   . Prenatal Vit-Fe Fumarate-FA (PRENATAL MULTIVITAMIN) TABS tablet Take 1 tablet by mouth daily at 12 noon.       I have reviewed patient's Past Medical Hx, Surgical Hx, Family Hx, Social Hx, medications and allergies.   ROS:  Review of Systems  Constitutional: Negative for fever.  Gastrointestinal: Positive for abdominal pain. Negative for anorexia, constipation, diarrhea, nausea and vomiting.  Genitourinary: Negative for dysuria and frequency.  Musculoskeletal: Negative for myalgias.   Other systems negative  Physical Exam   Patient Vitals for the past 24 hrs:  BP Temp Pulse  03/22/20 1634 129/83 99.4 F (37.4 C) (!) 113   Constitutional: Well-developed, well-nourished female in no acute distress.  Cardiovascular: normal rate and rhythm Respiratory: normal effort, clear to auscultation bilaterally GI: Abd soft, non-tender, gravid appropriate for gestational age.   No rebound or guarding. MS: Extremities nontender, no edema, normal ROM Neurologic: Alert and oriented x 4.  GU: Neg CVAT.  PELVIC EXAM: Cervix pink, visually closed, without lesion, Clear  fluid pooling in vault, no purulent drainage   FHT:  154   Labs:  B/Positive/-- (05/13 1723)  Imaging:  [place] MFM OB DETAIL +14 WK  Result Date: 03/22/2020 ----------------------------------------------------------------------  OBSTETRICS REPORT                         (Signed Final 03/22/2020 05:39 pm) ---------------------------------------------------------------------- Patient Info  ID #:        [id number]                          D.O.B.:  [birth year] (24 yrs)  Name:        Latoya Hila                 Visit  Date: 03/22/2020 02:56 pm ---------------------------------------------------------------------- Performed By  Attending:         Lin Landsman      Ref. Address:      [address]                     MD                                        [address]                                                               Jacky Kindle  Performed By:      Marcellina Millin RDMS     Location[facility]  Referred By:       Wilmer [facility]-                     Craige Cotta CNM ---------------------------------------------------------------------- Orders  #   Description                          Code         Ordered By  1   [place] MFM OB DETAIL +14 WK              76811.01     Noralee Space ----------------------------------------------------------------------  #   Order #                    Accession #                 Episode #  1   [id number]                  [id number]                  [id number] ---------------------------------------------------------------------- Indications  Premature rupture of membranes - leaking fluid  O42.90  [redacted] weeks gestation of  pregnancy                 Z3A.21  Hypertension - Chronic/Pre-existing (no meds)   O10.019  Encounter for antenatal screening for           Z36.3  malformations (NIPS did not result)  Obesity complicating pregnancy, second          O99.212  trimester ---------------------------------------------------------------------- Fetal Evaluation  Num Of Fetuses:          1  Fetal Heart Rate(bpm):   164  Cardiac Activity:        Observed  Presentation:            Breech  Placenta:                Posterior  P. Cord Insertion:       Previously Visualized  Amniotic Fluid  AFI FV:      Oligohydramnios                              Largest Pocket(cm)                              0.8 ---------------------------------------------------------------------- Biometry  BPD:      46.1   mm     G. Age:  20w 0d        2.5  %     CI:         65.43  %    70 - 86                                                           FL/HC:       18.0  %    18.4 - 20.2  HC:        183   mm     G. Age:  20w 5d          7  %    HC/AC:       1.12       1.06 - 1.25  AC:      163.6   mm     G. Age:  21w 3d         33  %    FL/BPD:      71.6  %    71 - 87  FL:         33   mm     G. Age:  20w 2d          6  %    FL/AC:       20.2  %    20 - 24  HUM:        32   mm     G. Age:  20w 5d         23  %  CER:      22.2   mm     G. Age:  21w 1d         30  %  Est. FW:     382   gm    0 lb 13 oz      11  % ---------------------------------------------------------------------- OB History  Gravidity:  1         Term:  0          Prem:  0        SAB:   0  TOP:           0       Ectopic: 0         Living: 0 ---------------------------------------------------------------------- Gestational Age  LMP:            21w 5d       Date:  10/22/19                   EDD:  07/28/20  U/S Today:      20w 4d                                         EDD:  08/05/20  Best:           21w 5d    Det. By:  LMP  (10/22/19)            EDD:  07/28/20 ---------------------------------------------------------------------- Anatomy  Cranium:                Appears normal         LVOT:                   Appears normal  Cavum:                  Appears normal         Aortic Arch:            Not well visualized  Ventricles:             Appears normal         Ductal Arch:            Appears normal  Choroid Plexus:         Appears normal         Diaphragm:              Appears normal  Cerebellum:             Appears normal         Stomach:                Present but small  Posterior Fossa:        Not well visualized    Abdomen:                Appears normal  Nuchal Fold:            Not applicable (>40    Abdominal Wall:         Not well visualized                          wks GA)  Face:                   Orbits nl; profile not Cord Vessels:           Not well visualized                          well  visualized  Lips:                   Not  well visualized    Kidneys:                Bilat pyelectasis,                                                                         Rt 5mm, Lt 6mm  Palate:                 Not well visualized    Bladder:                Appears normal  Thoracic:               Appears normal         Spine:                  Not well visualized  Heart:                  Appears normal         Upper Extremities:      Visualized                          (4CH, axis, and situs)  RVOT:                   Appears normal         Lower Extremities:      Visualized  Other:   Technically difficult due to low amniotic fluid. Nasal bone visualized.           Fetus appears to be a female. ---------------------------------------------------------------------- Cervix Uterus Adnexa  Cervix  Length:             3.4  cm.  Normal appearance by transabdominal scan.  Adnexa  No abnormality visualized. ---------------------------------------------------------------------- Impression  Single intrauterine pregnancy measurements consistent with  dates, with known PPROM  Intrauterine growth restriction is observed, with renal  pyelectatsis. The chest appears small.  Suboptimal views of the fetal anatomy were obtained  secondary to oligohydramnios.  Latoya Taylor reports not feeling well. Her blood pressure is mildly  elevated 155/97 and 135/95 pulse 125 and 122 She is tender  from her Right LUS to upper fundal area. There are no signs of  peritonitis.  I reviewed today's imaging with Latoya Taylor and expressed my  concerns for suspected chorioamnnionitis and recommended  further evaluation at the MAU.  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 I discussed the poor prognosis even if the  fetus makes it to a viable gestational age.  I reviewed today's counseling with Dr. Shawnie Pons and Dr. Debroah Loop. ---------------------------------------------------------------------- Recommendations  Follow up as  clinically indicated.  Evaluation for chorioamniotis, consider delivery if present.  Close follow up if discharged. ----------------------------------------------------------------------               Lin Landsman, MD Electronically Signed Final Report   03/22/2020 05:39 pm ----------------------------------------------------------------------  MAU Course/MDM: I have ordered CBC and reviewed results.  WBC is stable at 15k  (unchanged from 03/02/20) Consult Dr Jolayne Panther with presentation, exam findings and test results. She recommends discharge home with conservative management.  Advises MD appt this week to determine  date of admission and plan of care.   Patient also concerned with intermittent hand swelling with numbness in fingers at times.  Recommend wrist splints and low salt diet.   Assessment: 1. Preterm premature rupture of membranes (PPROM) with unknown onset of labor   2. Pre-existing essential hypertension during pregnancy, antepartum   3. Supervision of high risk pregnancy, antepartum     Plan: Discharge home Strict fever/chills return precautions. Preterm Labor precautions  Follow up in Office for prenatal visit for plan of care with MD  Encouraged to return here or to other Urgent Care/ED if she develops worsening of symptoms, increase in pain, fever, or other concerning symptoms.   Pt stable at time of discharge.  Wynelle Bourgeois CNM, MSN Certified Nurse-Midwife 03/22/2020 5:04 PM

## 2020-03-22 NOTE — Discharge Instructions (Signed)
Premature Rupture and Preterm Premature Rupture of Membranes  A sac made up of membranes surrounds your baby in the womb (uterus). Rupture of membranes is when this sac breaks open. This is also known as your "water breaking." When this sac breaks before labor starts, it is called premature rupture of membranes (PROM). If this happens before 37 weeks of being pregnant, it is called preterm premature rupture of membranes (PPROM). PPROM is serious. It needs medical care right away. What increases the risk of PPROM? PPROM is more likely to happen in women who:  Have an infection.  Have had PPROM before.  Have a cervix that is short.  Have bleeding during the second or third trimester.  Have a low BMI. This is a measure of body fat.  Smoke.  Use drugs.  Have a low socioeconomic status. What problems can be caused by PROM and PPROM? This condition creates health dangers for the mother and the baby. These include:  Giving birth to the baby too early (prematurely).  Getting a serious infection of the placenta (chorioamnionitis).  Having the placenta detach from the uterus early (placental abruption).  Squeezing of the umbilical cord.  Getting a serious infection after delivery. What are the signs of PROM and PPROM?  A sudden gush of fluid from the vagina.  A slow leak of fluid from the vagina.  Your underwear is wet. What should I do if I think my water broke? Call your doctor right away. You will need to go to the hospital to get checked right away. What happens if I am told that I have PROM or PPROM? You will have tests done at the hospital.  If you have PROM, you may be given medicine to start labor (be induced). This may be done if you are not having contractions during the 24 hours after your water broke.  If you have PPROM and are not having contractions, you may be given medicine to start labor. It will depend on how far along you are in your pregnancy. If you have  PPROM:  You and your baby will be watched closely to see if you have infections or other problems.  You may be given: ? An antibiotic medicine. This can stop an infection from starting. ? A steroid medicine. This can help your baby's lungs develop faster. ? A medicine to help prevent cerebral palsy in your baby. ? A medicine to stop early labor (preterm labor).  You may be told to stay in bed except to use the bathroom (bed rest).  You may be given medicine to start labor. This may be done if there are problems with you or the baby. Your treatment will depend on many factors. Contact a doctor if:  Your water breaks and you are not having contractions. Get help right away if:  Your water breaks before you are [redacted] weeks pregnant. Summary  When your water breaks before labor starts, it is called premature rupture of membranes (PROM).  When PROM happens before 37 weeks of pregnancy, it is called preterm premature rupture of membranes (PPROM).  If you are not having contractions, your labor may be started for you. This information is not intended to replace advice given to you by your health care provider. Make sure you discuss any questions you have with your health care provider. Document Revised: 01/29/2019 Document Reviewed: 06/27/2016 Elsevier Patient Education  2020 [employer]  

## 2020-03-22 NOTE — MAU Note (Signed)
Pt sent from MFM for possible infection.( Decreased fluidv s SRom) since 9 weeks. Pt has some tenderness on right plevis when the doctor pressed on the area.

## 2020-03-23 ENCOUNTER — Ambulatory Visit (INDEPENDENT_AMBULATORY_CARE_PROVIDER_SITE_OTHER): Payer: Medicaid Other | Admitting: Family Medicine

## 2020-03-23 VITALS — BP 117/83 | HR 104

## 2020-03-23 DIAGNOSIS — Z3A22 22 weeks gestation of pregnancy: Secondary | ICD-10-CM

## 2020-03-23 DIAGNOSIS — O0990 Supervision of high risk pregnancy, unspecified, unspecified trimester: Secondary | ICD-10-CM

## 2020-03-23 DIAGNOSIS — O0992 Supervision of high risk pregnancy, unspecified, second trimester: Secondary | ICD-10-CM | POA: Diagnosis not present

## 2020-03-23 DIAGNOSIS — O10012 Pre-existing essential hypertension complicating pregnancy, second trimester: Secondary | ICD-10-CM | POA: Diagnosis not present

## 2020-03-23 DIAGNOSIS — O10019 Pre-existing essential hypertension complicating pregnancy, unspecified trimester: Secondary | ICD-10-CM

## 2020-03-23 DIAGNOSIS — O42912 Preterm premature rupture of membranes, unspecified as to length of time between rupture and onset of labor, second trimester: Secondary | ICD-10-CM | POA: Diagnosis not present

## 2020-03-23 DIAGNOSIS — O42919 Preterm premature rupture of membranes, unspecified as to length of time between rupture and onset of labor, unspecified trimester: Secondary | ICD-10-CM

## 2020-03-23 LAB — GC/CHLAMYDIA PROBE AMP (~~LOC~~) NOT AT ARMC
Chlamydia: NEGATIVE
Comment: NEGATIVE
Comment: NORMAL
Neisseria Gonorrhea: NEGATIVE

## 2020-03-23 MED ORDER — BLOOD PRESSURE KIT DEVI: 1.0000 | Freq: Once | 0 refills | Status: AC

## 2020-03-24 ENCOUNTER — Other Ambulatory Visit: Payer: Self-pay

## 2020-03-24 ENCOUNTER — Encounter (HOSPITAL_COMMUNITY): Payer: Self-pay | Admitting: Obstetrics & Gynecology

## 2020-03-24 ENCOUNTER — Inpatient Hospital Stay (HOSPITAL_COMMUNITY)
Admission: AD | Admit: 2020-03-24 | Discharge: 2020-03-25 | DRG: 805 | Disposition: A | Discharge status: Home / Self Care | Payer: Medicaid Other | Attending: Obstetrics & Gynecology | Admitting: Obstetrics & Gynecology

## 2020-03-24 DIAGNOSIS — O42112 Preterm premature rupture of membranes, onset of labor more than 24 hours following rupture, second trimester: Secondary | ICD-10-CM | POA: Diagnosis not present

## 2020-03-24 DIAGNOSIS — O328XX0 Maternal care for other malpresentation of fetus, not applicable or unspecified: Secondary | ICD-10-CM | POA: Diagnosis present

## 2020-03-24 DIAGNOSIS — O9912 Other diseases of the blood and blood-forming organs and certain disorders involving the immune mechanism complicating childbirth: Secondary | ICD-10-CM | POA: Diagnosis present

## 2020-03-24 DIAGNOSIS — D72829 Elevated white blood cell count, unspecified: Secondary | ICD-10-CM | POA: Diagnosis present

## 2020-03-24 DIAGNOSIS — O42912 Preterm premature rupture of membranes, unspecified as to length of time between rupture and onset of labor, second trimester: Secondary | ICD-10-CM | POA: Diagnosis present

## 2020-03-24 DIAGNOSIS — O42919 Preterm premature rupture of membranes, unspecified as to length of time between rupture and onset of labor, unspecified trimester: Secondary | ICD-10-CM

## 2020-03-24 DIAGNOSIS — O10019 Pre-existing essential hypertension complicating pregnancy, unspecified trimester: Secondary | ICD-10-CM

## 2020-03-24 DIAGNOSIS — Z3A22 22 weeks gestation of pregnancy: Secondary | ICD-10-CM | POA: Diagnosis not present

## 2020-03-24 DIAGNOSIS — O1002 Pre-existing essential hypertension complicating childbirth: Secondary | ICD-10-CM | POA: Diagnosis present

## 2020-03-24 DIAGNOSIS — Z20822 Contact with and (suspected) exposure to covid-19: Secondary | ICD-10-CM | POA: Diagnosis present

## 2020-03-24 DIAGNOSIS — Z7982 Long term (current) use of aspirin: Secondary | ICD-10-CM

## 2020-03-24 DIAGNOSIS — O4290 Premature rupture of membranes, unspecified as to length of time between rupture and onset of labor, unspecified weeks of gestation: Secondary | ICD-10-CM | POA: Diagnosis present

## 2020-03-24 DIAGNOSIS — O169 Unspecified maternal hypertension, unspecified trimester: Secondary | ICD-10-CM | POA: Diagnosis present

## 2020-03-24 HISTORY — DX: Unilateral inguinal hernia, without obstruction or gangrene, not specified as recurrent: K40.90

## 2020-03-24 LAB — CBC WITH DIFFERENTIAL/PLATELET
Abs Immature Granulocytes: 0.23 10*3/uL — ABNORMAL HIGH (ref 0.00–0.07)
Basophils Absolute: 0.1 10*3/uL (ref 0.0–0.1)
Basophils Relative: 0 %
Eosinophils Absolute: 0.2 10*3/uL (ref 0.0–0.5)
Eosinophils Relative: 1 %
HCT: 39.9 % (ref 36.0–46.0)
Hemoglobin: 13.9 g/dL (ref 12.0–15.0)
Immature Granulocytes: 1 %
Lymphocytes Relative: 6 %
Lymphs Abs: 1.2 10*3/uL (ref 0.7–4.0)
MCH: 33.2 pg (ref 26.0–34.0)
MCHC: 34.8 g/dL (ref 30.0–36.0)
MCV: 95.2 fL (ref 80.0–100.0)
Monocytes Absolute: 1.2 10*3/uL — ABNORMAL HIGH (ref 0.1–1.0)
Monocytes Relative: 6 %
Neutro Abs: 16.8 10*3/uL — ABNORMAL HIGH (ref 1.7–7.7)
Neutrophils Relative %: 86 %
Platelets: 247 10*3/uL (ref 150–400)
RBC: 4.19 MIL/uL (ref 3.87–5.11)
RDW: 11.9 % (ref 11.5–15.5)
WBC: 19.7 10*3/uL — ABNORMAL HIGH (ref 4.0–10.5)
nRBC: 0 % (ref 0.0–0.2)

## 2020-03-24 LAB — URINALYSIS, ROUTINE W REFLEX MICROSCOPIC
Bilirubin Urine: NEGATIVE
Glucose, UA: NEGATIVE mg/dL
Hgb urine dipstick: NEGATIVE
Ketones, ur: 20 mg/dL — AB
Leukocytes,Ua: NEGATIVE
Nitrite: NEGATIVE
Protein, ur: NEGATIVE mg/dL
Specific Gravity, Urine: 1.013 (ref 1.005–1.030)
pH: 7 (ref 5.0–8.0)

## 2020-03-24 LAB — SARS CORONAVIRUS 2 BY RT PCR (HOSPITAL ORDER, PERFORMED IN ~~LOC~~ HOSPITAL LAB): SARS Coronavirus 2: NEGATIVE

## 2020-03-24 LAB — TYPE AND SCREEN
ABO/RH(D): B POS
Antibody Screen: NEGATIVE

## 2020-03-24 LAB — ABO/RH: ABO/RH(D): B POS

## 2020-03-24 MED ORDER — OXYCODONE-ACETAMINOPHEN 5-325 MG PO TABS
2.0000 | ORAL_TABLET | ORAL | Status: DC | PRN
  Administered 2020-03-24: 2 via ORAL
  Filled 2020-03-24: qty 2

## 2020-03-24 MED ORDER — OXYTOCIN 10 UNIT/ML IJ SOLN
INTRAMUSCULAR | Status: AC
  Administered 2020-03-24: 10 [IU]
  Filled 2020-03-24: qty 1

## 2020-03-24 MED ORDER — ONDANSETRON HCL 4 MG PO TABS: 4.0000 mg | ORAL_TABLET | ORAL | Status: DC | PRN

## 2020-03-24 MED ORDER — SIMETHICONE 80 MG PO CHEW: 80.0000 mg | CHEWABLE_TABLET | ORAL | Status: DC | PRN

## 2020-03-24 MED ORDER — IBUPROFEN 600 MG PO TABS
600.0000 mg | ORAL_TABLET | Freq: Once | ORAL | Status: AC
  Administered 2020-03-24: 600 mg via ORAL
  Filled 2020-03-24: qty 1

## 2020-03-24 MED ORDER — FENTANYL CITRATE (PF) 100 MCG/2ML IJ SOLN
INTRAMUSCULAR | Status: AC
  Administered 2020-03-24: 100 ug via INTRAVENOUS
  Filled 2020-03-24: qty 2

## 2020-03-24 MED ORDER — SOD CITRATE-CITRIC ACID 500-334 MG/5ML PO SOLN: 30.0000 mL | ORAL | Status: DC | PRN

## 2020-03-24 MED ORDER — LACTATED RINGERS IV SOLN: INTRAVENOUS | Status: DC

## 2020-03-24 MED ORDER — PRENATAL MULTIVITAMIN CH
1.0000 | ORAL_TABLET | Freq: Every day | ORAL | Status: DC
  Administered 2020-03-25: 1 via ORAL
  Filled 2020-03-24: qty 1

## 2020-03-24 MED ORDER — FENTANYL CITRATE (PF) 100 MCG/2ML IJ SOLN: 100.0000 ug | INTRAMUSCULAR | Status: DC | PRN

## 2020-03-24 MED ORDER — OXYCODONE-ACETAMINOPHEN 5-325 MG PO TABS
2.0000 | ORAL_TABLET | Freq: Once | ORAL | Status: AC
  Administered 2020-03-24: 2 via ORAL
  Filled 2020-03-24: qty 2

## 2020-03-24 MED ORDER — ACETAMINOPHEN 325 MG PO TABS: 650.0000 mg | ORAL_TABLET | ORAL | Status: DC | PRN

## 2020-03-24 MED ORDER — IBUPROFEN 600 MG PO TABS
600.0000 mg | ORAL_TABLET | Freq: Four times a day (QID) | ORAL | Status: DC
  Administered 2020-03-24 – 2020-03-25 (×3): 600 mg via ORAL
  Filled 2020-03-24 (×3): qty 1

## 2020-03-24 MED ORDER — FERROUS SULFATE 325 (65 FE) MG PO TABS
325.0000 mg | ORAL_TABLET | Freq: Two times a day (BID) | ORAL | Status: DC
  Administered 2020-03-24: 325 mg via ORAL
  Filled 2020-03-24: qty 1

## 2020-03-24 MED ORDER — OXYTOCIN 10 UNIT/ML IJ SOLN: 10.0000 [IU] | Freq: Once | INTRAMUSCULAR | Status: DC

## 2020-03-24 MED ORDER — BENZOCAINE-MENTHOL 20-0.5 % EX AERO: 1.0000 "application " | INHALATION_SPRAY | CUTANEOUS | Status: DC | PRN

## 2020-03-24 MED ORDER — DIBUCAINE (PERIANAL) 1 % EX OINT: 1.0000 "application " | TOPICAL_OINTMENT | CUTANEOUS | Status: DC | PRN

## 2020-03-24 MED ORDER — LACTATED RINGERS IV SOLN: 500.0000 mL | INTRAVENOUS | Status: DC | PRN

## 2020-03-24 MED ORDER — ZOLPIDEM TARTRATE 5 MG PO TABS: 5.0000 mg | ORAL_TABLET | Freq: Every evening | ORAL | Status: DC | PRN

## 2020-03-24 MED ORDER — TETANUS-DIPHTH-ACELL PERTUSSIS 5-2.5-18.5 LF-MCG/0.5 IM SUSP: 0.5000 mL | Freq: Once | INTRAMUSCULAR | Status: DC

## 2020-03-24 MED ORDER — MISOPROSTOL 200 MCG PO TABS
ORAL_TABLET | ORAL | Status: AC
  Filled 2020-03-24: qty 4

## 2020-03-24 MED ORDER — DIPHENHYDRAMINE HCL 25 MG PO CAPS: 25.0000 mg | ORAL_CAPSULE | Freq: Four times a day (QID) | ORAL | Status: DC | PRN

## 2020-03-24 MED ORDER — MISOPROSTOL 200 MCG PO TABS
800.0000 ug | ORAL_TABLET | Freq: Once | ORAL | Status: AC
  Administered 2020-03-24: 800 ug via BUCCAL

## 2020-03-24 MED ORDER — LIDOCAINE HCL (PF) 1 % IJ SOLN: 30.0000 mL | INTRAMUSCULAR | Status: DC | PRN

## 2020-03-24 MED ORDER — OXYCODONE-ACETAMINOPHEN 5-325 MG PO TABS: 1.0000 | ORAL_TABLET | ORAL | Status: DC | PRN

## 2020-03-24 MED ORDER — OXYTOCIN-SODIUM CHLORIDE 30-0.9 UT/500ML-% IV SOLN: 2.5000 [IU]/h | INTRAVENOUS | Status: DC

## 2020-03-24 MED ORDER — MAGNESIUM HYDROXIDE 400 MG/5ML PO SUSP: 30.0000 mL | ORAL | Status: DC | PRN

## 2020-03-24 MED ORDER — MEASLES, MUMPS & RUBELLA VAC IJ SOLR: 0.5000 mL | Freq: Once | INTRAMUSCULAR | Status: DC

## 2020-03-24 MED ORDER — WITCH HAZEL-GLYCERIN EX PADS: 1.0000 "application " | MEDICATED_PAD | CUTANEOUS | Status: DC | PRN

## 2020-03-24 MED ORDER — ONDANSETRON HCL 4 MG/2ML IJ SOLN: 4.0000 mg | Freq: Four times a day (QID) | INTRAMUSCULAR | Status: DC | PRN

## 2020-03-24 MED ORDER — OXYCODONE-ACETAMINOPHEN 5-325 MG PO TABS: 2.0000 | ORAL_TABLET | ORAL | Status: DC | PRN

## 2020-03-24 MED ORDER — COCONUT OIL OIL: 1.0000 "application " | TOPICAL_OIL | Status: DC | PRN

## 2020-03-24 MED ORDER — ONDANSETRON HCL 4 MG/2ML IJ SOLN: 4.0000 mg | INTRAMUSCULAR | Status: DC | PRN

## 2020-03-24 MED ORDER — OXYTOCIN BOLUS FROM INFUSION: 500.0000 mL | Freq: Once | INTRAVENOUS | Status: DC

## 2020-03-24 NOTE — Progress Notes (Signed)
Estanislado Spire, NP notified Dr. Debroah Loop of pt cervical exam and increased discomfort.  MD responded to transfer pt to L&D.

## 2020-03-24 NOTE — Consult Note (Signed)
Neonatal Medicine 03/24/2020 5:26 PM  Ronnette Hila [id number]  I was asked by Dr. Debroah Loop to speak to this mother regarding the impending delivery of a 22.0 week baby.  The pregnancy was complicated by ROM at 13 weeks, with subsequent oligohydramnios.  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  I told Dr. Debroah Loop we do not resuscitate babies less than 23 week, but would be willing to talk to the patient.  I arrived just as the mother was delivering the baby vaginally (footling breech).  The body was delivered, follow after about a minute by delivery of head.  The baby was quite small, limp, with no movement.  I could see that the staff was delivering a non-viable extremely premature baby, and the mother and father appeared to be accepting.  So I withdrew from the room to let the [facility] staff manage this pre-viable deliveries according to their usual practice.  I got a call from a nurse about 20 minutes later to please come back to the room and talk with the parents, who did not seem to understand that the baby was pre-viable.   When I re-entered the room, the father was holding the baby (wrapped in a couple of blankets) while the mom was engaged with the staff and her cell phone.  I asked if I might examine the baby--the father agreed and passed the baby to me.  I placed the child on the foot of mom's bed and unwrapped him.  He was very cold, without any activity other than a faint movement of his chest of his beating heart, which was about 50 bpm.  He weighed 439 grams when later measured by nursing.  He looked consistent with [redacted] weeks gestation.  I wrapped him back in his blankets then gave him back to his father.  I then told him there was nothing we could do to save the baby.  Baby's who are born this early, with this long history ruptured membranes that leads to lungs which do not grow and develop adequately, do not survive in my experience.  We do  not offer resuscitation to babies born at 22 weeks.  He appeared to accept this information and thanked me.    ___________________ Angelita Ingles, MD Attending Neonatologist

## 2020-03-24 NOTE — MAU Note (Signed)
Feeling contractions and lower back has been hurting.  Coming ? Every 5-8 min. No bleeding. Has continued to leak fluid.

## 2020-03-24 NOTE — Patient Instructions (Signed)

## 2020-03-24 NOTE — H&P (Signed)
Chief Complaint: Back Pain and Contractions   First Provider Initiated Contact with Patient 03/24/20 1149     SUBJECTIVE HPI: Latoya Taylor is a 24 y.o. G1P0 at [redacted]w[redacted]d who presents to Maternity Admissions reporting back pain and contractions.  Symptoms started this morning, reports contractions every 5 to 8 minutes.  Continues to leak clear fluid.  Denies vaginal bleeding.  Denies fever.  Diagnosed with PPROM in the first trimester.  Location: abdomen Quality: contractions Severity: 9/10 on pain scale Duration: hours Timing: every 5-8 minutes Modifying factors: none Associated signs and symptoms: LOF  Past Medical History:  Diagnosis Date  . Hernia, inguinal   . Medical history non-contributory    OB History  Gravida Para Term Preterm AB Living  1         0  SAB TAB Ectopic Multiple Live Births               # Outcome Date GA Lbr Len/2nd Weight Sex Delivery Anes PTL Lv  1 Current            Past Surgical History:  Procedure Laterality Date  . NO PAST SURGERIES     Social History   Socioeconomic History  . Marital status: Married    Spouse name: Not on file  . Number of children: Not on file  . Years of education: Not on file  . Highest education level: Not on file  Occupational History  . Occupation: [employer]  Tobacco Use  . Smoking status: Never Smoker  . Smokeless tobacco: Never Used  Substance and Sexual Activity  . Alcohol use: Never  . Drug use: Never  . Sexual activity: Yes    Birth control/protection: None  Other Topics Concern  . Not on file  Social History Narrative  . Not on file   Social Determinants of Health   Financial Resource Strain:   . Difficulty of Paying Living Expenses:   Food Insecurity: No Food Insecurity  . Worried About Programme researcher, broadcasting/film/video in the Last Year: Never true  . Ran Out of Food in the Last Year: Never true  Transportation Needs: No Transportation Needs  . Lack of Transportation (Medical): No  . Lack of Transportation  (Non-Medical): No  Physical Activity:   . Days of Exercise per Week:   . Minutes of Exercise per Session:   Stress:   . Feeling of Stress :   Social Connections:   . Frequency of Communication with Friends and Family:   . Frequency of Social Gatherings with Friends and Family:   . Attends Religious Services:   . Active Member of Clubs or Organizations:   . Attends Banker Meetings:   Marland Kitchen Marital Status:   Intimate Partner Violence:   . Fear of Current or Ex-Partner:   . Emotionally Abused:   Marland Kitchen Physically Abused:   . Sexually Abused:    Family History  Problem Relation Age of Onset  . Hypertension Mother   . Hypertension Father   . Hypertension Maternal Grandmother   . Diabetes type II Maternal Grandmother    No current facility-administered medications on file prior to encounter.   Current Outpatient Medications on File Prior to Encounter  Medication Sig Dispense Refill  . aspirin EC 81 MG tablet Take 1 tablet (81 mg total) by mouth daily. 90 tablet 3  . Prenatal Vit-Fe Fumarate-FA (PRENATAL MULTIVITAMIN) TABS tablet Take 1 tablet by mouth daily at 12 noon.     No Known Allergies  I  have reviewed patient's Past Medical Hx, Surgical Hx, Family Hx, Social Hx, medications and allergies.   Review of Systems  Constitutional: Negative.   Gastrointestinal: Positive for abdominal pain.  Genitourinary: Positive for vaginal discharge. Negative for vaginal bleeding.  Musculoskeletal: Positive for back pain.    OBJECTIVE Patient Vitals for the past 24 hrs:  BP Temp Temp src Pulse Resp SpO2 Height Weight  03/24/20 1545 (!) 144/97 -- -- (!) 105 -- -- -- --  03/24/20 1530 135/82 -- -- (!) 105 -- -- -- --  03/24/20 1515 (!) 147/97 (!) 96.7 F (35.9 C) Axillary (!) 102 -- -- -- --  03/24/20 1500 (!) 136/91 -- -- (!) 104 20 -- -- --  03/24/20 1445 128/89 -- -- (!) 106 -- 95 % -- --  03/24/20 1443 136/84 -- -- (!) 109 -- -- -- --  03/24/20 1218 -- 98.6 F (37 C) Oral --  -- -- -- --  03/24/20 1049 (!) 138/93 98.9 F (37.2 C) Oral (!) 116 18 98 % 4\' 8"  (1.422 m) 68 kg   Constitutional: Well-developed, well-nourished female in no acute distress.  Cardiovascular: normal rate & rhythm, no murmur Respiratory: normal rate and effort. Lung sounds clear throughout GI: Abd soft, non-tender, Pos BS x 4. No guarding or rebound tenderness MS: Extremities nontender, no edema, normal ROM Neurologic: Alert and oriented x 4.  GU: Sterile spec exam: Pooling of clear fluid.  Cervix visually closed and no fetal parts seen.  No blood   LAB RESULTS Results for orders placed or performed during the hospital encounter of 03/24/20 (from the past 24 hour(s))  Urinalysis, Routine w reflex microscopic     Status: Abnormal   Collection Time: 03/24/20 11:14 AM  Result Value Ref Range   Color, Urine YELLOW YELLOW   APPearance CLEAR CLEAR   Specific Gravity, Urine 1.013 1.005 - 1.030   pH 7.0 5.0 - 8.0   Glucose, UA NEGATIVE NEGATIVE mg/dL   Hgb urine dipstick NEGATIVE NEGATIVE   Bilirubin Urine NEGATIVE NEGATIVE   Ketones, ur 20 (A) NEGATIVE mg/dL   Protein, ur NEGATIVE NEGATIVE mg/dL   Nitrite NEGATIVE NEGATIVE   Leukocytes,Ua NEGATIVE NEGATIVE  CBC with Differential/Platelet     Status: Abnormal   Collection Time: 03/24/20 11:33 AM  Result Value Ref Range   WBC 19.7 (H) 4.0 - 10.5 K/uL   RBC 4.19 3.87 - 5.11 MIL/uL   Hemoglobin 13.9 12.0 - 15.0 g/dL   HCT 05/24/20 50.9 - 32.6 %   MCV 95.2 80.0 - 100.0 fL   MCH 33.2 26.0 - 34.0 pg   MCHC 34.8 30.0 - 36.0 g/dL   RDW 71.2 45.8 - 09.9 %   Platelets 247 150 - 400 K/uL   nRBC 0.0 0.0 - 0.2 %   Neutrophils Relative % 86 %   Neutro Abs 16.8 (H) 1.7 - 7.7 K/uL   Lymphocytes Relative 6 %   Lymphs Abs 1.2 0.7 - 4.0 K/uL   Monocytes Relative 6 %   Monocytes Absolute 1.2 (H) 0.1 - 1.0 K/uL   Eosinophils Relative 1 %   Eosinophils Absolute 0.2 0.0 - 0.5 K/uL   Basophils Relative 0 %   Basophils Absolute 0.1 0.0 - 0.1 K/uL    Immature Granulocytes 1 %   Abs Immature Granulocytes 0.23 (H) 0.00 - 0.07 K/uL  Type and screen [facility]     Status: None   Collection Time: 03/24/20  2:15 PM  Result Value Ref Range  ABO/RH(D) B POS    Antibody Screen NEG    Sample Expiration      03/27/2020,2359 Performed at [facility], [address], [place], [place] [address]   SARS Coronavirus 2 by RT PCR (hospital order, performed in [facility] hospital lab) Nasopharyngeal Nasopharyngeal Swab     Status: None   Collection Time: 03/24/20  2:19 PM   Specimen: Nasopharyngeal Swab  Result Value Ref Range   SARS Coronavirus 2 NEGATIVE NEGATIVE    IMAGING No results found.  MAU COURSE Orders Placed This Encounter  Procedures  . SARS Coronavirus 2 by RT PCR (hospital order, performed in [facility] hospital lab) Nasopharyngeal Nasopharyngeal Swab  . CBC with Differential/Platelet  . Urinalysis, Routine w reflex microscopic  . RPR  . Diet clear liquid Room service appropriate? Yes; Fluid consistency: Thin  . Vitals signs per unit policy  . Notify Physician  . Fetal monitoring per unit policy  . Activity as tolerated  . Cervical Exam  . Measure blood pressure post delivery every 15 min x 1 hour then every 30 min x 1 hour  . Fundal check post delivery every 15 min x 1 hour then every 30 min x 1 hour  . If Rapid HIV test positive or known HIV positive: initiate AZT orders  . May in and out cath x 2 for inability to void  . Insert foley catheter  . Discontinue foley prior to vaginal delivery  . Initiate Carrier Fluid Protocol  . Initiate Oral Care Protocol  . Order Rapid HIV per protocol if no results on chart  . SCDs  . Informed Consent Details: Physician/Practitioner Attestation; Transcribe to consent form and obtain patient signature  . Patient may have epidural placement upon request  . Full code  . Consult to neonatology Consult Timeframe: STAT - requires a response within one hour; STAT  timeframe requires provider to provider communication, has the provider to provider communication been completed: Yes; Reason for Consult? previable pregnan...  . Consult to spiritual care  . Type and screen [facility]  . ABO/Rh  . Insert and maintain IV Line  . Admit to Inpatient (patient's expected length of stay will be greater than 2 midnights or inpatient only procedure)   Meds ordered this encounter  Medications  . oxyCODONE-acetaminophen (PERCOCET/ROXICET) 5-325 MG per tablet 2 tablet  . fentaNYL (SUBLIMAZE) injection 100 mcg  . misoprostol (CYTOTEC) tablet 800 mcg  . fentaNYL (SUBLIMAZE) 100 MCG/2ML injection    Spurlock-Frizzell, J: cabinet override  . lactated ringers infusion  . lactated ringers infusion  . oxytocin (PITOCIN) IV BOLUS FROM BAG  . oxytocin (PITOCIN) IV infusion 30 units in NS 500 mL - Premix  . lactated ringers infusion 500-1,000 mL  . acetaminophen (TYLENOL) tablet 650 mg  . oxyCODONE-acetaminophen (PERCOCET/ROXICET) 5-325 MG per tablet 1 tablet  . oxyCODONE-acetaminophen (PERCOCET/ROXICET) 5-325 MG per tablet 2 tablet  . ondansetron (ZOFRAN) injection 4 mg  . sodium citrate-citric acid (ORACIT) solution 30 mL  . lidocaine (PF) (XYLOCAINE) 1 % injection 30 mL  . misoprostol (CYTOTEC) 200 MCG tablet    Koran, Heather   : cabinet override  . oxytocin (PITOCIN) injection 10 Units  . oxytocin (PITOCIN) 10 UNIT/ML injection    Morris, Ginger   : cabinet override  . ibuprofen (ADVIL) tablet 600 mg    MDM Fetal heart tones present via Doppler.  Patient visibly uncomfortable during contractions which appear to be every 5 to 6 minutes on  toco.  Sterile speculum exam performed with presence of fluid, no blood, cervix visually closed.  After 1 hour of monitoring, digital exam was performed with sterile glove, cervix is dilated to 1 cm. Patient given oral Percocet for pain with no relief, declines IV/IM meds at this time.   Worsening leukocytosis  since previous visit.  Patient remains afebrile.  Patient called out with worsening pain. Cervical exam, 6-7 cm dilated. Unable to move to birthing suites due to census. Manya Silvas CNM called to bedside for impending delivery.   ASSESSMENT 1. Preterm premature rupture of membranes (PPROM) with unknown onset of labor   2. Pre-existing essential hypertension during pregnancy, antepartum   3. Preterm delivery, delivered   4. [redacted] weeks gestation of pregnancy     PLAN CNM at bedside for delivery, see delivery note.    Woodroe Mode, MD 03/24/2020  4:19 PM

## 2020-03-24 NOTE — Progress Notes (Signed)
   PRENATAL VISIT NOTE  Subjective:  Latoya Taylor is a 24 y.o. G1P0 at 91w0dbeing seen today for ongoing prenatal care.  She is currently monitored for the following issues for this high-risk pregnancy and has Supervision of high risk pregnancy, antepartum; Preterm premature rupture of membranes (PPROM) with unknown onset of labor; and Hypertension in pregnancy, antepartum on their problem list.  Patient reports no complaints.  Contractions: Not present. Vag. Bleeding: None.  Movement: Present. Denies leaking of fluid.   The following portions of the patient's history were reviewed and updated as appropriate: allergies, current medications, past family history, past medical history, past social history, past surgical history and problem list.   Objective:   Vitals:   03/23/20 1524  BP: 117/83  Pulse: (!) 104    Fetal Status: Fetal Heart Rate (bpm): 150   Movement: Present     General:  Alert, oriented and cooperative. Patient is in no acute distress.  Skin: Skin is warm and dry. No rash noted.   Cardiovascular: Normal heart rate noted  Respiratory: Normal respiratory effort, no problems with respiration noted  Abdomen: Soft, gravid, appropriate for gestational age.  Pain/Pressure: Present     Pelvic: Cervical exam deferred        Extremities: Normal range of motion.  Edema: Trace  Mental Status: Normal mood and affect. Normal behavior. Normal judgment and thought content.   Assessment and Plan:  Pregnancy: G1P0 at 274w0d. Supervision of high risk pregnancy, antepartum BP cuff given - Blood Pressure Monitoring (BLOOD PRESSURE KIT) DEVI; 1 Device by Does not apply route once for 1 dose.  Dispense: 1 each; Refill: 0  2. Pre-existing essential hypertension during pregnancy, antepartum BP is well controlled on no meds. Continue aspirin  3. Preterm premature rupture of membranes (PPROM) with unknown onset of labor  Should she make it to viability, risks associated with  pre-viable PPROM include classical C-section and fetal sepsis and distress. Chances of pulmonary hypoplasia and inability to adequately ventilate the baby at time of birth and risks of pre-maturity including major handicaps and Cerebral Palsy were also discussed.  She is on pelvic rest, daily temps and plan for admission when she reaches viability or becomes infected. She is fairly adamant she does not want a classical C-section. Baby is breech. 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 She also, does not desire termination at this stage. She does not want admission until 26 weeks. Risks/benefits discussed at length. She would like to have her husband be part of this conversation and as such we have scheduled her back for a virtual visit with her husband next week.   General obstetric precautions including but not limited to vaginal bleeding, contractions, leaking of fluid and fetal movement were reviewed in detail with the patient. Please refer to After Visit Summary for other counseling recommendations.   No follow-ups on file.  Future Appointments  Date Time Provider [place]6/06/2020  2:35 PM PrDonnamae JudeMD [facility]  TaDonnamae JudeMD

## 2020-03-24 NOTE — MAU Provider Note (Signed)
Chief Complaint: Back Pain and Contractions   First Provider Initiated Contact with Patient 03/24/20 1149     SUBJECTIVE HPI: Latoya Taylor is a 24 y.o. G1P0 at [redacted]w[redacted]d who presents to Maternity Admissions reporting back pain and contractions.  Symptoms started this morning, reports contractions every 5 to 8 minutes.  Continues to leak clear fluid.  Denies vaginal bleeding.  Denies fever.  Diagnosed with PPROM in the first trimester.  Location: abdomen Quality: contractions Severity: 9/10 on pain scale Duration: hours Timing: every 5-8 minutes Modifying factors: none Associated signs and symptoms: LOF  Past Medical History:  Diagnosis Date  . Hernia, inguinal   . Medical history non-contributory    OB History  Gravida Para Term Preterm AB Living  1         0  SAB TAB Ectopic Multiple Live Births               # Outcome Date GA Lbr Len/2nd Weight Sex Delivery Anes PTL Lv  1 Current            Past Surgical History:  Procedure Laterality Date  . NO PAST SURGERIES     Social History   Socioeconomic History  . Marital status: Married    Spouse name: Not on file  . Number of children: Not on file  . Years of education: Not on file  . Highest education level: Not on file  Occupational History  . Occupation: [employer]  Tobacco Use  . Smoking status: Never Smoker  . Smokeless tobacco: Never Used  Substance and Sexual Activity  . Alcohol use: Never  . Drug use: Never  . Sexual activity: Yes    Birth control/protection: None  Other Topics Concern  . Not on file  Social History Narrative  . Not on file   Social Determinants of Health   Financial Resource Strain:   . Difficulty of Paying Living Expenses:   Food Insecurity: No Food Insecurity  . Worried About Programme researcher, broadcasting/film/video in the Last Year: Never true  . Ran Out of Food in the Last Year: Never true  Transportation Needs: No Transportation Needs  . Lack of Transportation (Medical): No  . Lack of Transportation  (Non-Medical): No  Physical Activity:   . Days of Exercise per Week:   . Minutes of Exercise per Session:   Stress:   . Feeling of Stress :   Social Connections:   . Frequency of Communication with Friends and Family:   . Frequency of Social Gatherings with Friends and Family:   . Attends Religious Services:   . Active Member of Clubs or Organizations:   . Attends Banker Meetings:   Marland Kitchen Marital Status:   Intimate Partner Violence:   . Fear of Current or Ex-Partner:   . Emotionally Abused:   Marland Kitchen Physically Abused:   . Sexually Abused:    Family History  Problem Relation Age of Onset  . Hypertension Mother   . Hypertension Father   . Hypertension Maternal Grandmother   . Diabetes type II Maternal Grandmother    No current facility-administered medications on file prior to encounter.   Current Outpatient Medications on File Prior to Encounter  Medication Sig Dispense Refill  . aspirin EC 81 MG tablet Take 1 tablet (81 mg total) by mouth daily. 90 tablet 3  . Prenatal Vit-Fe Fumarate-FA (PRENATAL MULTIVITAMIN) TABS tablet Take 1 tablet by mouth daily at 12 noon.     No Known Allergies  I  have reviewed patient's Past Medical Hx, Surgical Hx, Family Hx, Social Hx, medications and allergies.   Review of Systems  Constitutional: Negative.   Gastrointestinal: Positive for abdominal pain.  Genitourinary: Positive for vaginal discharge. Negative for vaginal bleeding.  Musculoskeletal: Positive for back pain.    OBJECTIVE Patient Vitals for the past 24 hrs:  BP Temp Temp src Pulse Resp SpO2 Height Weight  03/24/20 1218 -- 98.6 F (37 C) Oral -- -- -- -- --  03/24/20 1049 (!) 138/93 98.9 F (37.2 C) Oral (!) 116 18 98 % 4\' 8"  (1.422 m) 68 kg   Constitutional: Well-developed, well-nourished female in no acute distress.  Cardiovascular: normal rate & rhythm, no murmur Respiratory: normal rate and effort. Lung sounds clear throughout GI: Abd soft, non-tender, Pos BS x  4. No guarding or rebound tenderness MS: Extremities nontender, no edema, normal ROM Neurologic: Alert and oriented x 4.  GU: Sterile spec exam: Pooling of clear fluid.  Cervix visually closed and no fetal parts seen.  No blood   LAB RESULTS Results for orders placed or performed during the hospital encounter of 03/24/20 (from the past 24 hour(s))  Urinalysis, Routine w reflex microscopic     Status: Abnormal   Collection Time: 03/24/20 11:14 AM  Result Value Ref Range   Color, Urine YELLOW YELLOW   APPearance CLEAR CLEAR   Specific Gravity, Urine 1.013 1.005 - 1.030   pH 7.0 5.0 - 8.0   Glucose, UA NEGATIVE NEGATIVE mg/dL   Hgb urine dipstick NEGATIVE NEGATIVE   Bilirubin Urine NEGATIVE NEGATIVE   Ketones, ur 20 (A) NEGATIVE mg/dL   Protein, ur NEGATIVE NEGATIVE mg/dL   Nitrite NEGATIVE NEGATIVE   Leukocytes,Ua NEGATIVE NEGATIVE  CBC with Differential/Platelet     Status: Abnormal   Collection Time: 03/24/20 11:33 AM  Result Value Ref Range   WBC 19.7 (H) 4.0 - 10.5 K/uL   RBC 4.19 3.87 - 5.11 MIL/uL   Hemoglobin 13.9 12.0 - 15.0 g/dL   HCT 05/24/20 57.3 - 22.0 %   MCV 95.2 80.0 - 100.0 fL   MCH 33.2 26.0 - 34.0 pg   MCHC 34.8 30.0 - 36.0 g/dL   RDW 25.4 27.0 - 62.3 %   Platelets 247 150 - 400 K/uL   nRBC 0.0 0.0 - 0.2 %   Neutrophils Relative % 86 %   Neutro Abs 16.8 (H) 1.7 - 7.7 K/uL   Lymphocytes Relative 6 %   Lymphs Abs 1.2 0.7 - 4.0 K/uL   Monocytes Relative 6 %   Monocytes Absolute 1.2 (H) 0.1 - 1.0 K/uL   Eosinophils Relative 1 %   Eosinophils Absolute 0.2 0.0 - 0.5 K/uL   Basophils Relative 0 %   Basophils Absolute 0.1 0.0 - 0.1 K/uL   Immature Granulocytes 1 %   Abs Immature Granulocytes 0.23 (H) 0.00 - 0.07 K/uL    IMAGING No results found.  MAU COURSE Orders Placed This Encounter  Procedures  . CBC with Differential/Platelet  . Urinalysis, Routine w reflex microscopic   Meds ordered this encounter  Medications  . oxyCODONE-acetaminophen  (PERCOCET/ROXICET) 5-325 MG per tablet 2 tablet    MDM Fetal heart tones present via Doppler.  Patient visibly uncomfortable during contractions which appear to be every 5 to 6 minutes on toco.  Sterile speculum exam performed with presence of fluid, no blood, cervix visually closed.  After 1 hour of monitoring, digital exam was performed with sterile glove, cervix is dilated to 1 cm. Patient  given oral Percocet for pain with no relief, declines IV/IM meds at this time.   Worsening leukocytosis since previous visit.  Patient remains afebrile.  Patient called out with worsening pain. Cervical exam, 6-7 cm dilated. Unable to move to birthing suites due to census. Manya Silvas CNM called to bedside for impending delivery.   ASSESSMENT 1. Preterm premature rupture of membranes (PPROM) with unknown onset of labor   2. Pre-existing essential hypertension during pregnancy, antepartum   3. Preterm delivery, delivered   4. [redacted] weeks gestation of pregnancy     PLAN CNM at bedside for delivery, see delivery note.    Jorje Guild, NP 03/24/2020  12:22 PM

## 2020-03-25 ENCOUNTER — Other Ambulatory Visit: Payer: Self-pay | Admitting: Advanced Practice Midwife

## 2020-03-25 DIAGNOSIS — Z634 Disappearance and death of family member: Secondary | ICD-10-CM

## 2020-03-25 MED ORDER — NORETHINDRONE ACET-ETHINYL EST 1-20 MG-MCG PO TABS: 1.0000 | ORAL_TABLET | Freq: Every day | ORAL | 2 refills | Status: AC

## 2020-03-25 MED ORDER — OXYCODONE-ACETAMINOPHEN 5-325 MG PO TABS: 1.0000 | ORAL_TABLET | ORAL | 0 refills | Status: AC | PRN

## 2020-03-25 MED ORDER — IBUPROFEN 600 MG PO TABS: 600.0000 mg | ORAL_TABLET | Freq: Four times a day (QID) | ORAL | 0 refills | Status: AC

## 2020-03-25 NOTE — Progress Notes (Signed)
The chaplain responded to a page for a fetal demise. The chaplain prayed with the family and over [place] after the nurse brought the baby back into the room. The chaplain is available for further support if needed.  Lavone Neri Chaplain Resident For questions concerning this note please contact me by pager [phone]

## 2020-03-25 NOTE — Progress Notes (Signed)
CSW visited MOB and FOB in room 110 to offer support and resources following fetal demise. CSW offered condolences and asked how she could serve them at this time. MOB and FOB asked questions about cremation process. CSW agreed to call funeral home with parents for clarifaction. FOB had already selected and contact funeral home. CSW, MOB, and FOB called funeral to get desired answers. Following call, CSW provided MOB and FOB with community grief and loss support and resources. FOB and MOB were appreciative of this support.   CSW will remain in a supportive role as needed.   Dania Marsan D. Dortha Kern, MSW, [facility] Clinical Social Worker [phone]

## 2020-03-25 NOTE — Discharge Summary (Addendum)
Postpartum Discharge Summary  Date of Service updated 03/25/20     Patient Name: Latoya Taylor DOB: [birth year] MRN: [id number]  Date of admission: 03/24/2020 Delivery date:03/24/2020  Delivering provider: Manya Silvas  Date of discharge: 03/25/2020  Admitting diagnosis: Premature rupture of membranes [O42.90] Intrauterine pregnancy: [redacted]w[redacted]d    Secondary diagnosis:  Active Problems:   Hypertension in pregnancy, antepartum   Premature rupture of membranes  Additional problems: NA    Discharge diagnosis: SVD at 22 weeks                                              Post partum procedures:NA Augmentation:  None Complications: Previable Preterm Delivery  Hospital course: Onset of Labor With Vaginal Delivery      24y.o. yo G1P0 at 261w1das admitted in Latent Labor on 03/24/2020 with painful contractions. Patient had an uncomplicated labor course as follows:  Membrane Rupture Time/Date:  ,  13 weeks Delivery Method:Vaginal, Spontaneous  Episiotomy: None  Lacerations:  None  Patient had an uncomplicated postpartum course.  She is ambulating, tolerating a regular diet, passing flatus, and urinating well. Patient is discharged home in stable condition on 03/25/20.  Newborn Data: Birth date:03/24/2020  Birth time:2:29 PM  Gender:Female  Living status: Born alive, but died at 1542Apgars:1 ,1  Weight:439 g   Magnesium Sulfate received: No BMZ received: No Rhophylac:No MMR:No T-DaP:NA Flu: N/A Transfusion:No  Physical exam  Vitals:   03/24/20 1926 03/24/20 2335 03/25/20 0402 03/25/20 0835  BP: 123/72 134/90 101/73 133/89  Pulse: (!) 105 (!) 110 99 (!) 104  Resp: 15 20 15 18   Temp: 98.6 F (37 C) 98.9 F (37.2 C) 97.7 F (36.5 C) 98.4 F (36.9 C)  TempSrc: Oral Oral Oral Oral  SpO2: 97% 96% 98% 98%  Weight:      Height:       General: alert Lochia: appropriate Uterine Fundus: firm Incision: N/A DVT Evaluation: No evidence of DVT seen on physical exam. Labs: Lab  Results  Component Value Date   WBC 19.7 (H) 03/24/2020   HGB 13.9 03/24/2020   HCT 39.9 03/24/2020   MCV 95.2 03/24/2020   PLT 247 03/24/2020   CMP Latest Ref Rng & Units 03/02/2020  Glucose 65 - 99 mg/dL 77  BUN 6 - 20 mg/dL 6  Creatinine 0.57 - 1.00 mg/dL 0.43(L)  Sodium 134 - 144 mmol/L 137  Potassium 3.5 - 5.2 mmol/L 3.7  Chloride 96 - 106 mmol/L 105  CO2 20 - 29 mmol/L 22  Calcium 8.7 - 10.2 mg/dL 9.5  Total Protein 6.0 - 8.5 g/dL 7.0  Total Bilirubin 0.0 - 1.2 mg/dL 0.7  Alkaline Phos 39 - 117 IU/L 60  AST 0 - 40 IU/L 17  ALT 0 - 32 IU/L 13   Edinburgh Score: No flowsheet data found.   After visit meds:  Allergies as of 03/25/2020   No Known Allergies     Medication List    STOP taking these medications   aspirin EC 81 MG tablet     TAKE these medications   ibuprofen 600 MG tablet Commonly known as: ADVIL Take 1 tablet (600 mg total) by mouth every 6 (six) hours.   norethindrone-ethinyl estradiol 1-20 MG-MCG tablet Commonly known as: Loestrin 1/20 (21) Take 1 tablet by mouth daily. Begin 2 wks after delivery  oxyCODONE-acetaminophen 5-325 MG tablet Commonly known as: PERCOCET/ROXICET Take 1 tablet by mouth every 4 (four) hours as needed (pain scale 4-7).   prenatal multivitamin Tabs tablet Take 1 tablet by mouth daily at 12 noon.        Discharge home in stable condition Infant Feeding: No evidence of DVT seen on physical exam. Infant [place] Discharge instruction: per After Visit Summary and Postpartum booklet. Activity: Advance as tolerated. Pelvic rest for 6 weeks.  Diet: routine diet Future Appointments: Future Appointments  Date Time Provider [place]  03/29/2020  2:35 PM Donnamae Jude, MD [facility]   Follow up Visit: [place] [facility] [place] [facility]. Schedule an appointment as soon as possible for a visit in 2 week(s).   Specialty: Obstetrics and  Gynecology Contact information: [place] [id number] [phone]           Please schedule this patient for a In person postpartum visit in 4 weeks with the following provider: Any provider and MFM for consult RE: risk of PPPROM, PTD in subsequent pregnancy . Additional Postpartum F/U:Postpartum Depression checkup and BP check 1 week  High risk pregnancy complicated by: HTN and First trimester PPPROM Delivery mode:  Vaginal, Spontaneous  Anticipated Birth Control:  Unsure   03/25/2020 Donnamae Jude, MD

## 2020-03-25 NOTE — Plan of Care (Signed)
Patient experienced a fetal demise and comfort care continues for this family. Postpartum assessments have been within normal limits pain is well controlled,and vital signs remain stable.

## 2020-03-28 LAB — SURGICAL PATHOLOGY

## 2020-03-29 ENCOUNTER — Other Ambulatory Visit: Payer: Self-pay

## 2020-03-29 ENCOUNTER — Telehealth: Payer: Medicaid Other | Admitting: Family Medicine

## 2020-04-05 ENCOUNTER — Other Ambulatory Visit: Payer: Self-pay

## 2020-04-05 ENCOUNTER — Encounter: Payer: Self-pay | Admitting: Obstetrics and Gynecology

## 2020-04-05 ENCOUNTER — Ambulatory Visit (INDEPENDENT_AMBULATORY_CARE_PROVIDER_SITE_OTHER): Payer: Medicaid Other | Admitting: Obstetrics and Gynecology

## 2020-04-05 DIAGNOSIS — Z8759 Personal history of other complications of pregnancy, childbirth and the puerperium: Secondary | ICD-10-CM

## 2020-04-05 DIAGNOSIS — Z1389 Encounter for screening for other disorder: Secondary | ICD-10-CM | POA: Diagnosis not present

## 2020-04-05 DIAGNOSIS — O364XX0 Maternal care for intrauterine death, not applicable or unspecified: Secondary | ICD-10-CM

## 2020-04-05 MED ORDER — NORGESTIMATE-ETH ESTRADIOL 0.25-35 MG-MCG PO TABS: 1.0000 | ORAL_TABLET | Freq: Every day | ORAL | 11 refills | Status: AC

## 2020-04-05 NOTE — Progress Notes (Signed)
Weight : 142.9lbs

## 2020-04-05 NOTE — Progress Notes (Signed)
Obstetrics/Postpartum Visit  Appointment Date: 04/05/2020  OBGYN Clinic: [facility]  Primary Care Provider: Patient, No Pcp Per  Chief Complaint:  Chief Complaint  Patient presents with  . Postpartum Care    History of Present Illness: Dennie Vecchio is a 24 y.o. African-American G1P0 (Patient's last menstrual period was 10/22/2019 (exact date).), seen for the above chief complaint. Her past medical history is significant for n/a.  She is s/p SVD on 03/24/20 at 22 weeks; she was discharged to home on PPD#1. Pregnancy complicated by PPROM at [redacted]w[redacted]d, labor at 22w with delivery of infant and fetal death about an hour later.  Complains of ongoing bleeding. Reports grief, she is having hard time going to store or out in public bc she see baby items and babies.   Vaginal bleeding or discharge: Yes  Breast or formula feeding: n/a Intercourse: No  Contraception: OCPs PP depression s/s: Yes  Any bowel or bladder issues: No  Pap smear: no abnormalities (date: 5/21)  Review of Systems: Positive for n/a.   Her 12 point review of systems is negative or as noted in the History of Present Illness.  Patient Active Problem List   Diagnosis Date Noted  . Premature rupture of membranes 03/24/2020  . Preterm premature rupture of membranes (PPROM) with unknown onset of labor 03/03/2020  . Hypertension in pregnancy, antepartum 03/03/2020  . Supervision of high risk pregnancy, antepartum 03/02/2020    Medications Annalyce Lanpher had no medications administered during this visit. Current Outpatient Medications  Medication Sig Dispense Refill  . ibuprofen (ADVIL) 600 MG tablet Take 1 tablet (600 mg total) by mouth every 6 (six) hours. 30 tablet 0  . norethindrone-ethinyl estradiol (LOESTRIN 1/20, 21,) 1-20 MG-MCG tablet Take 1 tablet by mouth daily. Begin 2 wks after delivery 3 Package 2  . norgestimate-ethinyl estradiol (ORTHO-CYCLEN) 0.25-35 MG-MCG tablet Take 1 tablet by mouth  daily. 28 tablet 11  . oxyCODONE-acetaminophen (PERCOCET/ROXICET) 5-325 MG tablet Take 1 tablet by mouth every 4 (four) hours as needed (pain scale 4-7). 21 tablet 0  . Prenatal Vit-Fe Fumarate-FA (PRENATAL MULTIVITAMIN) TABS tablet Take 1 tablet by mouth daily at 12 noon.     No current facility-administered medications for this visit.    Allergies Patient has no known allergies.  Physical Exam:  LMP 10/22/2019 (Exact Date)  There is no height or weight on file to calculate BMI. General appearance: Well nourished, well developed female in no acute distress.  Cardiovascular: regular rate and rhythm Respiratory:  Normal respiratory effort Abdomen:  non distended Breasts: not examined. Neuro/Psych:  Normal mood and affect.  Skin:  Warm and dry.    PP Depression Screening:    Edinburgh Postnatal Depression Scale - 04/05/20 1323      Edinburgh Postnatal Depression Scale:  In the Past 7 Days   I have been able to laugh and see the funny side of things. 1    I have looked forward with enjoyment to things. 0    I have blamed myself unnecessarily when things went wrong. 3    I have been anxious or worried for no good reason. 0    I have felt scared or panicky for no good reason. 0    Things have been getting on top of me. 2    I have been so unhappy that I have had difficulty sleeping. 2    I have felt sad or miserable. 1    I have been so unhappy that I  have been crying. 3    The thought of harming myself has occurred to me. 0    Edinburgh Postnatal Depression Scale Total 12           Assessment: Patient is a 24 y.o. G1P0 who is 2 weeks post partum from a SVD at 22 weeks, s/p pre-viable PPROM. Neonatal demise. She is doing okay, with significant and understandable grief. [facility]s [facility] to [facility].   Plan:   1. Postpartum state Doing well Requests OCPs for contraception  2. History of preterm premature rupture of membranes (PPROM) - early prenatal care with next pregnancy  3.  Fetal demise affecting delivery Denies SI/HI Accepts referral to [facility]   Essential components of care per ACOG recommendations:  1.  Mood and well being: Patient with positive depression screening today. Reviewed local resources for support.  - Patient does not use tobacco.  - hx of drug use? No    2. Infant care and feeding: pre-viable delivery with neonatal death shortly after delivery. -Social determinants of health (SDOH) reviewed in EPIC. No concerns  3. Sexuality, contraception and birth spacing - Patient does not want a pregnancy in the next year.  Desired family size is unknown children.  - Reviewed forms of contraception in tiered fashion. Patient desired oral contraceptives (estrogen/progesterone) today.   - Discussed birth spacing of 18 months  4. Sleep and fatigue -Encouraged family/partner/community support of 4 hrs of uninterrupted sleep to help with mood and fatigue  5. Physical Recovery  - Discussed patients delivery and complications - Patient had no laceration, perineal healing reviewed. Patient expressed understanding - Patient has urinary incontinence? No  - Patient is not safe to resume physical and sexual activity  6.  Health Maintenance - Last pap smear done 5/21 and was normal with negative HPV.   RTC 2-3 weeks   K. Therese Sarah, M.D. Attending Center for [employer] [facility])

## 2020-04-06 NOTE — BH Specialist Note (Signed)
Integrated Behavioral Health via Telemedicine Video (Caregility) Visit  04/06/2020 Latoya Taylor [id number]  Number of Integrated Behavioral Health visits: 1 Session Start time: 2:15  Session End time: 2:55 Total time: 40   Referring Provider: Dorathy Kinsman, CNM Type of Visit: Video Latoya/Family location: Home [facility] Provider location: Center for [facility] at [facility]  All persons participating in visit: Latoya Taylor and [facility] Chivon Lepage    Confirmed Latoya's address: Yes  Confirmed Latoya's phone number: Yes  Any changes to demographics: No   Confirmed Latoya's insurance: Yes  Any changes to Latoya's insurance: No   Discussed confidentiality: Yes   I connected with Estella Malatesta  by a video enabled telemedicine application (Caregility) and verified that I am speaking with the correct person using two identifiers.     I discussed the limitations of evaluation and management by telemedicine and the availability of in person appointments.  I discussed that the purpose of this visit is to provide behavioral health care while limiting exposure to the novel coronavirus.   Discussed there is a possibility of technology failure and discussed alternative modes of communication if that failure occurs.  I discussed that engaging in this virtual visit, they consent to the provision of behavioral healthcare and the services will be billed under their insurance.  Latoya and/or legal guardian expressed understanding and consented to virtual visit: Yes   PRESENTING CONCERNS: Latoya and/or family reports the following symptoms/concerns: Pt states her primary concern today is grieving the loss of her son one hour after birth at [redacted] weeks gestation.  Duration of problem: About one month   STRENGTHS (Protective Factors/Coping Skills): Supportive husband and family; positive outlook  GOALS ADDRESSED: Latoya will: 1.  Demonstrate ability  to: Continue healthy grieving over loss  INTERVENTIONS: Interventions utilized:  Supportive Counseling and Link to [employer] Standardized Assessments completed: GAD-7 and PHQ 9  ASSESSMENT: Latoya currently experiencing Grief .   Latoya may benefit from psychoeducation and brief therapeutic interventions regarding coping with grief. Marland Kitchen  PLAN: 1. Follow up with behavioral health clinician on : Call Manali Mcelmurry at [phone] if needed 2. Behavioral recommendations:  -Continue to allow feelings of grief -Prioritize self-care (heathy eating and sleeping) until at least postpartum visit on 04/20/20 -Continue with plan to spend time with family over the upcoming holiday -Consider grief resources (on After Visit Summary), as needed 3. Referral(s): Integrated Art gallery manager (In Clinic) and [employer]:  Grief resources  I discussed the assessment and treatment plan with the Latoya and/or parent/guardian. They were provided an opportunity to ask questions and all were answered. They agreed with the plan and demonstrated an understanding of the instructions.   They were advised to call Taylor or seek an in-person evaluation if the symptoms worsen or if the condition fails to improve as anticipated.  Valetta Close Silvio Sausedo  Depression screen [facility] 2/9 04/12/2020 03/23/2020 03/02/2020  Decreased Interest 0 0 1  Down, Depressed, Hopeless 1 0 0  PHQ - 2 Score 1 0 1  Altered sleeping 0 0 0  Tired, decreased energy 0 1 1  Change in appetite 0 0 0  Feeling bad or failure about yourself  0 0 0  Trouble concentrating 0 0 0  Moving slowly or fidgety/restless 0 0 0  Suicidal thoughts 0 0 0  PHQ-9 Score 1 1 2    GAD 7 : Generalized Anxiety Score 04/12/2020 03/23/2020 03/02/2020  Nervous, Anxious, on Edge 0 0 2  Control/stop worrying 0  0 1  Worry too much - different things 0 0 1  Trouble relaxing 1 1 1   Restless 0 0 0  Easily annoyed or irritable 0 0 1  Afraid - awful might happen 0 0  0  Total GAD 7 Score 1 1 6

## 2020-04-12 ENCOUNTER — Ambulatory Visit (INDEPENDENT_AMBULATORY_CARE_PROVIDER_SITE_OTHER): Payer: Medicaid Other | Admitting: Clinical

## 2020-04-12 DIAGNOSIS — Z634 Disappearance and death of family member: Secondary | ICD-10-CM | POA: Diagnosis not present

## 2020-04-12 DIAGNOSIS — F4321 Adjustment disorder with depressed mood: Secondary | ICD-10-CM

## 2020-04-12 DIAGNOSIS — O906 Postpartum mood disturbance: Secondary | ICD-10-CM

## 2020-04-12 NOTE — Patient Instructions (Addendum)
  Hello Latoya Taylor, These are resources that have been helpful to women and families experiencing grief:   [place] virtual bereavement support group will be facilitated by pastoral care and perinatal education.  To start, this group will meet once a month. The registration location for this support group is on [place]'s website > your wellbeing > classes and support groups > support groups  [facility] (Individual and group grief support)   [facility]  ([phone]   Also:  www.BrideEmporium.nl  www.nationalshare.org  www.missfoundation.org  www.nilmdts.org        www.stillstandingmag.com  www.rtzhope.org   Center for [facility] Healthcare at [facility] [address] [place], [place] [address] [phone] (main office) [phone] ([name]'s office)

## 2020-04-20 ENCOUNTER — Encounter: Payer: Self-pay | Admitting: Obstetrics and Gynecology

## 2020-04-20 ENCOUNTER — Telehealth (INDEPENDENT_AMBULATORY_CARE_PROVIDER_SITE_OTHER): Payer: Medicaid Other | Admitting: Obstetrics and Gynecology

## 2020-04-20 DIAGNOSIS — R030 Elevated blood-pressure reading, without diagnosis of hypertension: Secondary | ICD-10-CM

## 2020-04-20 DIAGNOSIS — R05 Cough: Secondary | ICD-10-CM

## 2020-04-20 DIAGNOSIS — R059 Cough, unspecified: Secondary | ICD-10-CM

## 2020-04-20 DIAGNOSIS — Z7189 Other specified counseling: Secondary | ICD-10-CM

## 2020-04-20 DIAGNOSIS — Z3009 Encounter for other general counseling and advice on contraception: Secondary | ICD-10-CM

## 2020-04-20 MED ORDER — NIFEDIPINE ER OSMOTIC RELEASE 30 MG PO TB24: 30.0000 mg | ORAL_TABLET | Freq: Every day | ORAL | 2 refills | Status: AC

## 2020-04-20 NOTE — Progress Notes (Signed)
BP today is elevated: 141/102. Pt reports she began coughing yesterday, husband has been sick. Reports headache since yesterday afternoon; took ibuprofen with no improvement. Denies other s/s of htn.   Fleet Contras RN 04/20/20

## 2020-04-20 NOTE — Progress Notes (Signed)
° ° °  GYNECOLOGY VIRTUAL VISIT ENCOUNTER NOTE  Provider location: Center for [facility] at [facility]   I connected with Latoya Taylor on 04/20/20 at 10:15 AM EDT by MyChart Video Encounter at home and verified that I am speaking with the correct person using two identifiers.   I discussed the limitations, risks, security and privacy concerns of performing an evaluation and management service virtually and the availability of in person appointments. I also discussed with the patient that there may be a patient responsible charge related to this service. The patient expressed understanding and agreed to proceed.   History:  Latoya Taylor is a 24 y.o. G1P0 female being followed up for postpartum. Had PPROM and delivery of 22 week infant on 03/24/20. States she is doing okay, saw BH and declines return visit.  Started coughing and has a headache, started yesterday. Took ibuprofen and it has not helped. Denies nausea/vomiting, issues with going to the bathroom, fever. Husband is also sick, states he had cough/sniffles and he is getting better.    Past Medical History:  Diagnosis Date   Hernia, inguinal    Medical history non-contributory    Past Surgical History:  Procedure Laterality Date   NO PAST SURGERIES     The following portions of the patient's history were reviewed and updated as appropriate: allergies, current medications, past family history, past medical history, past social history, past surgical history and problem list.    Review of Systems:  Pertinent items noted in HPI and remainder of comprehensive ROS otherwise negative.  Physical Exam:   General:  Alert, oriented and cooperative. Patient appears to be in no acute distress. Somewhat flat affect  Mental Status: Normal mood and affect. Normal behavior. Normal judgment and thought content.   Respiratory: Normal respiratory effort, no problems with respiration noted  Rest of physical exam deferred due  to type of encounter  Labs and Imaging No results found for this or any previous visit (from the past 336 hour(s)).   Assessment and Plan:   1. Cough To get COVID tested  2. Elevated blood pressure reading without diagnosis of hypertension Recheck was 143/109 Start nifedipine 30 mg XL RN visit for BP check 1 week  3. Postpartum state Doing okay Feels she is coping Declines repeat visit with BH  4. Counseled about COVID-19 virus infection To call to make appointment to get tested  5. Encounter for counseling regarding contraception Start OCPs 05/05/20 Already has prescription    I discussed the assessment and treatment plan with the patient. The patient was provided an opportunity to ask questions and all were answered. The patient agreed with the plan and demonstrated an understanding of the instructions.   The patient was advised to call back or seek an in-person evaluation/go to the ED if the symptoms worsen or if the condition fails to improve as anticipated.  I provided 22 minutes of face-to-face time during this encounter.   Conan Bowens, MD Center for [facility] Healthcare, [facility]

## 2020-07-04 ENCOUNTER — Encounter: Payer: Self-pay | Admitting: *Deleted

## 2020-11-07 IMAGING — US US OB < 14 WEEKS - US OB TV
1 series · 15 of 27 positions shown · non-contrast
Comparison: None.

CLINICAL DATA: Leaking fluid. Evaluate viability. Quantitative beta
based on her last menstrual period.

EXAM:
OBSTETRIC <14 WK ULTRASOUND
TECHNIQUE: Transabdominal ultrasound was performed for evaluation of the
gestation as well as the maternal uterus and adnexal regions.

[Series 1: us ob < 14 weeks - us ob tv · 27 acquisitions, 15 frames shown]
[im 1/27]
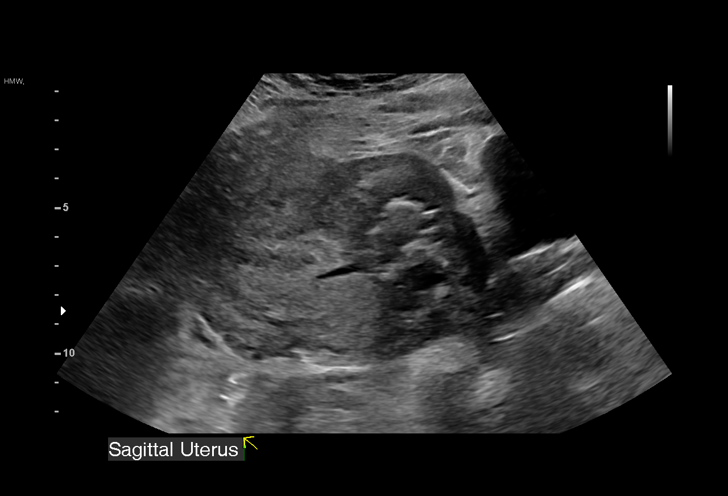
[im 3/27]
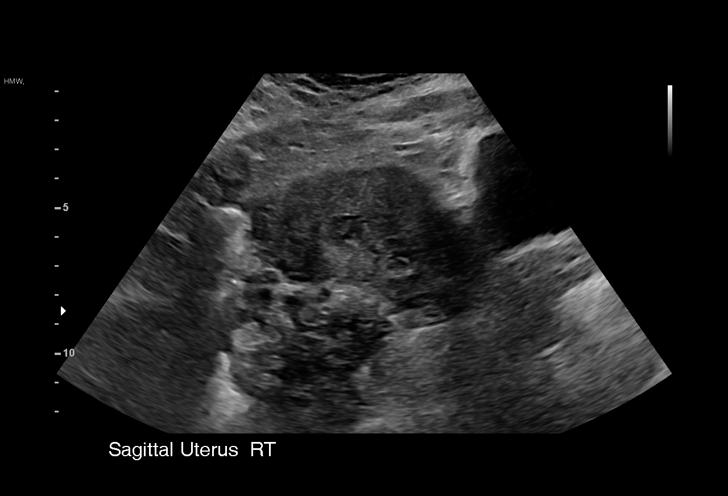
[im 5/27]
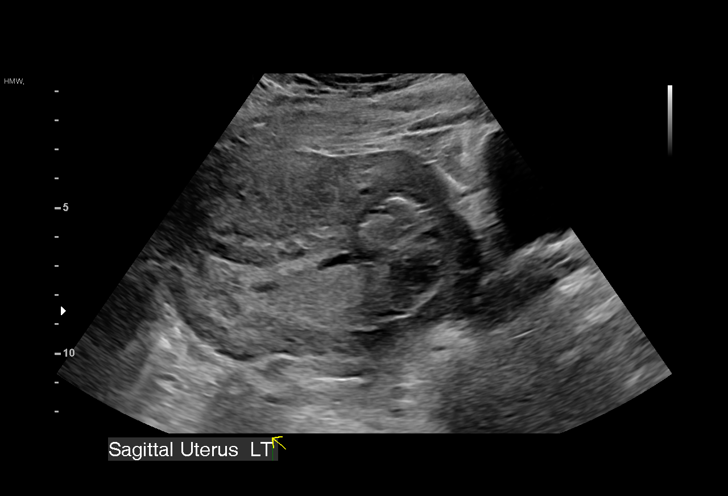
[im 7/27]
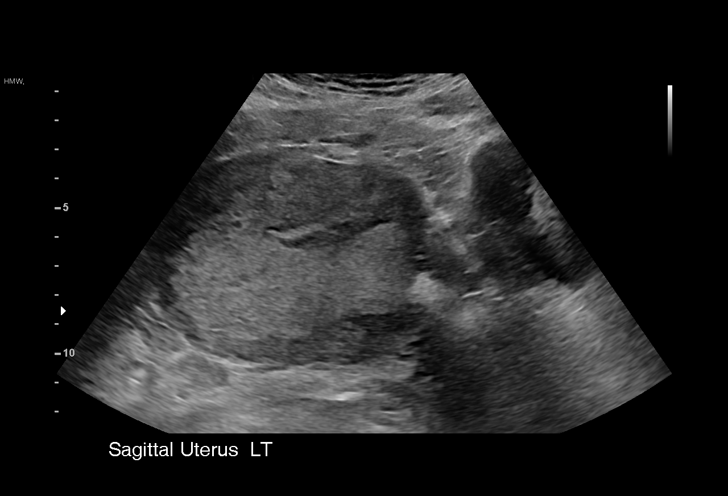
[im 9/27]
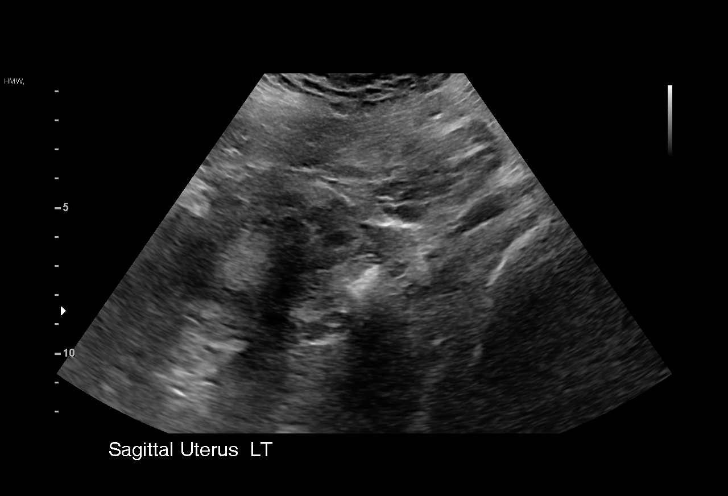
[im 10/27]
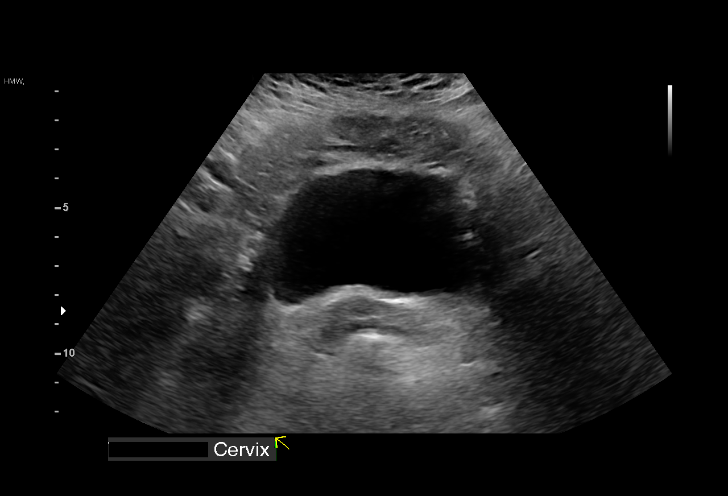
[im 12/27]
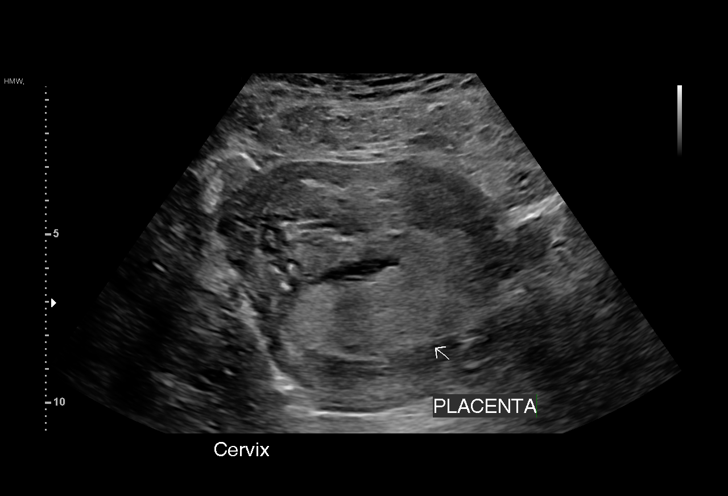
[im 14/27]
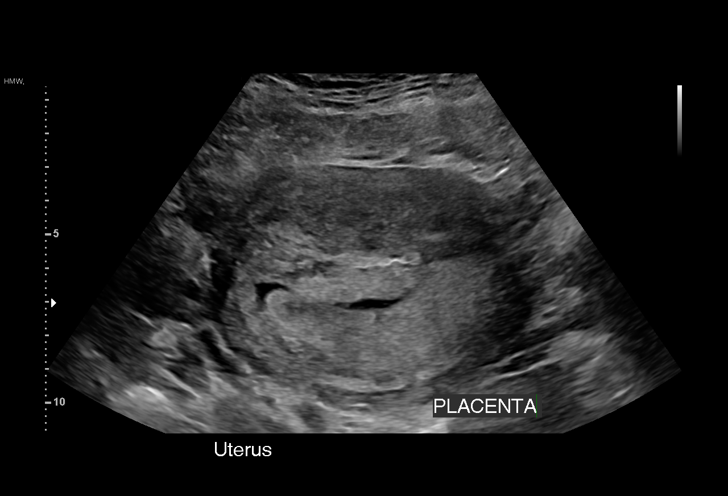
[im 16/27]
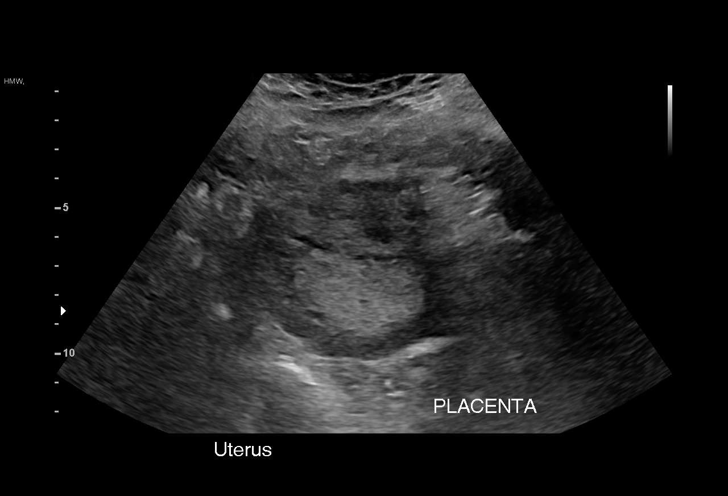
[im 18/27]
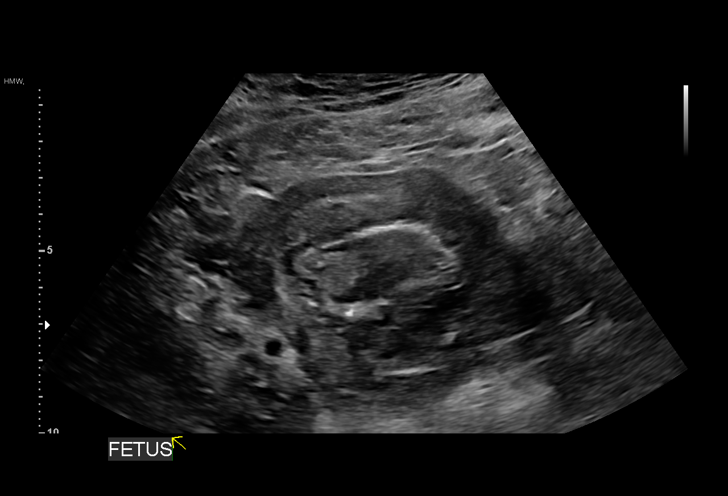
[im 19/27]
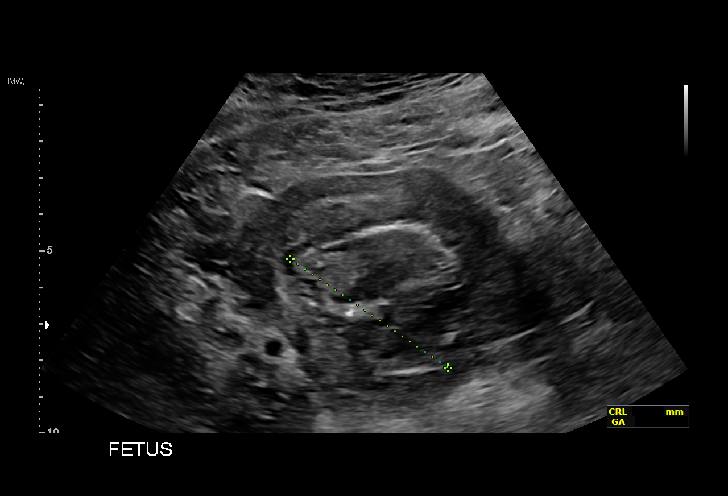
[im 21/27]
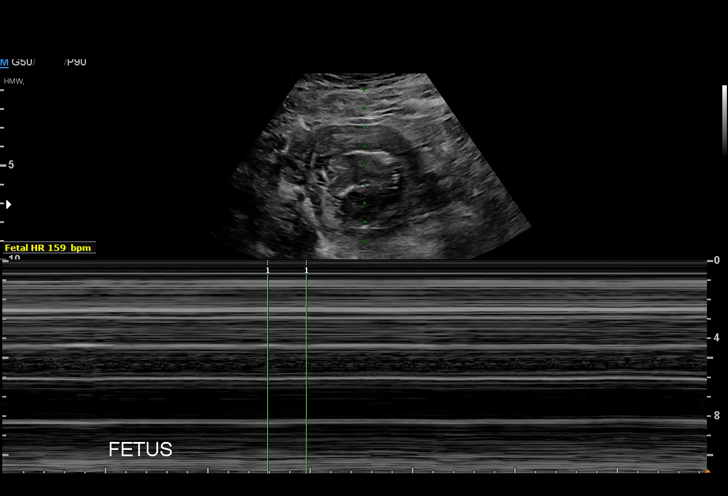
[im 23/27]
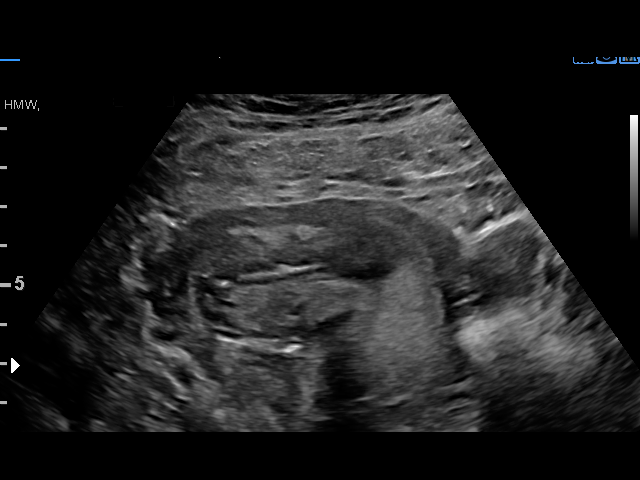
[im 25/27]
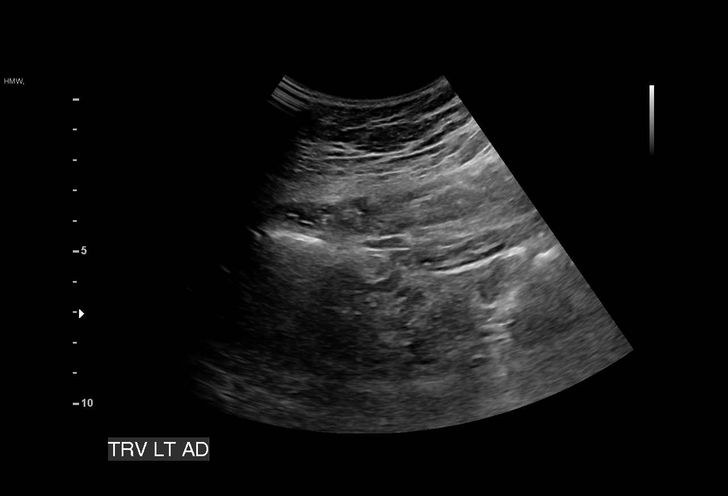
[im 27/27]
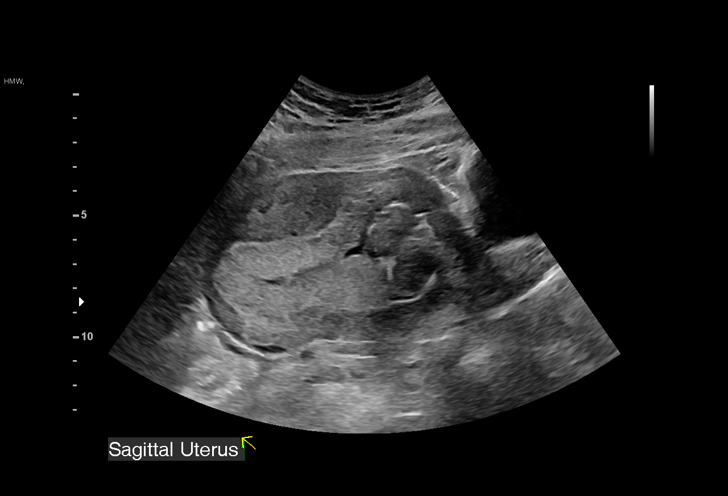

[15 of 27 positions shown; findings below may reference images not displayed]

FINDINGS: Intrauterine gestational sac: Single, but not well-defined due to
adversely no amniotic fluid surround embryo.

Yolk sac:  Not Visualized.

Embryo:  Visualized.

Cardiac Activity: Visualized.

Heart Rate: 159 bpm

CRL:   49.57 mm   11 w 5 d                  US EDC: 08/12/2020

Subchorionic hemorrhage:  None visualized.

Maternal uterus/adnexae: Virtually no amniotic fluid detected
surrounding the embryo. No uterine masses. Cervix is unremarkable.

No adnexal mass.  Neither ovary visualized.
IMPRESSION: 1. Marked paucity of amniotic fluid, with virtually no fluid seen
surrounding the embryo.
2. Single live intrauterine pregnancy with a measured gestational
age of 11 weeks and 5 days.

## 2020-12-04 IMAGING — US US MFM OB LIMITED
1 series · 14 of 15 positions shown · non-contrast
Comparison: none

[Series 1: us mfm ob limited · 15 acquisitions, 14 frames shown]
[im 1/15]
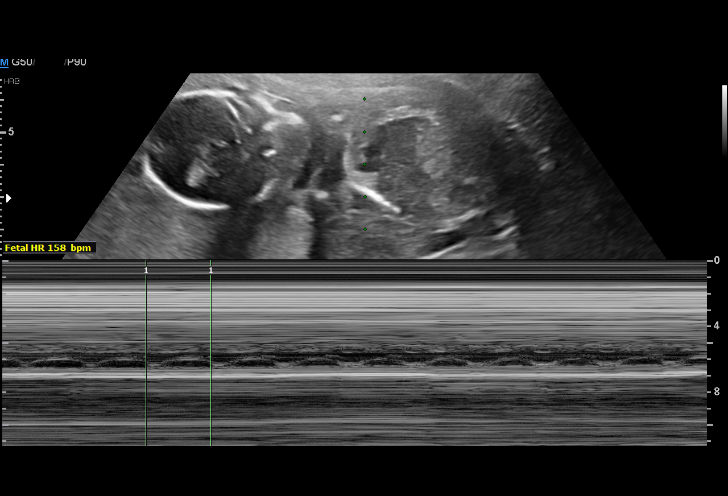
[im 2/15]
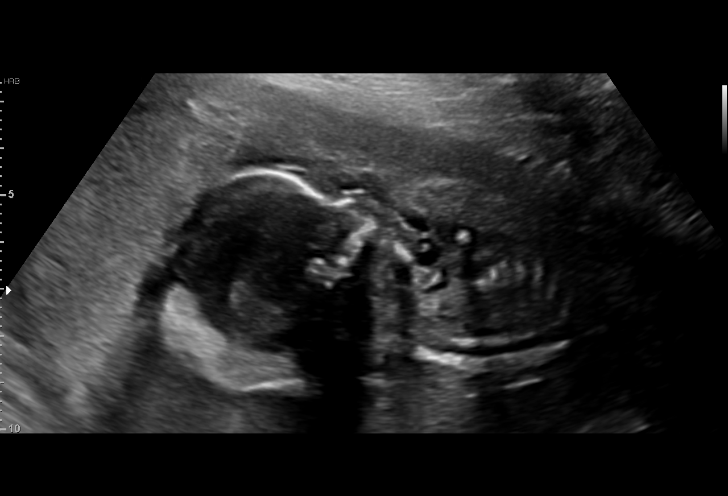
[im 3/15]
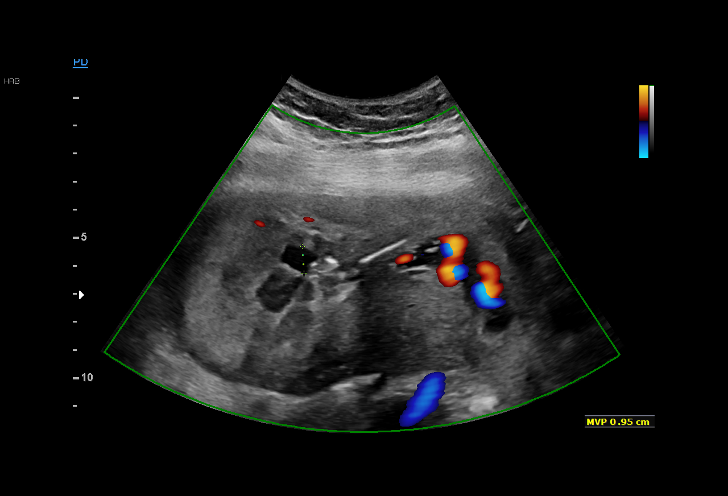
[im 4/15]
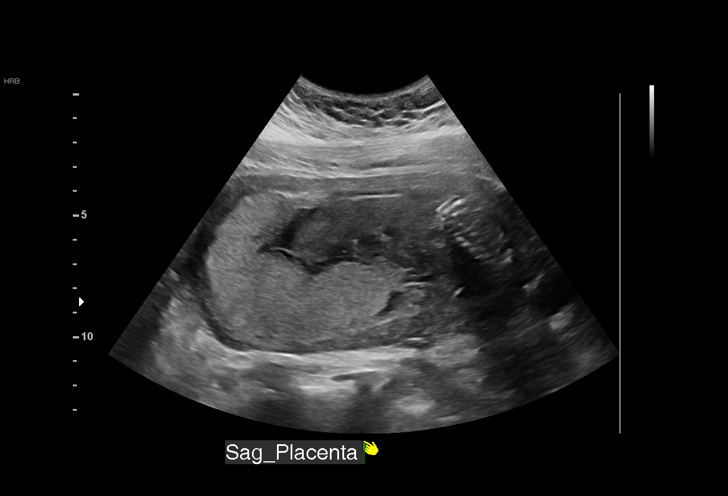
[im 5/15]
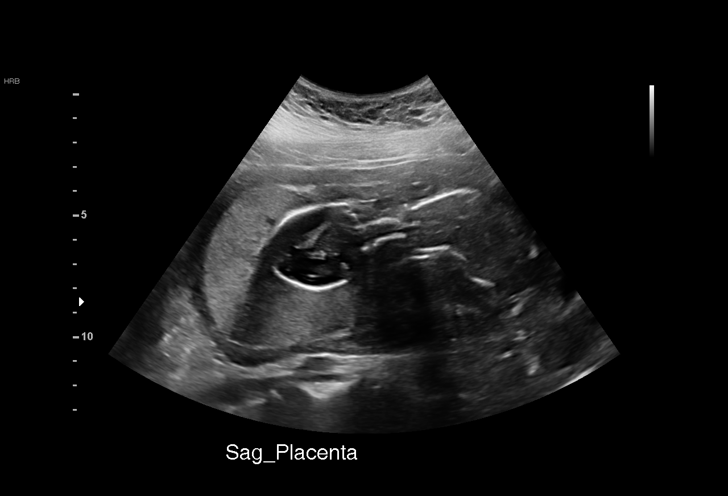
[im 6/15]
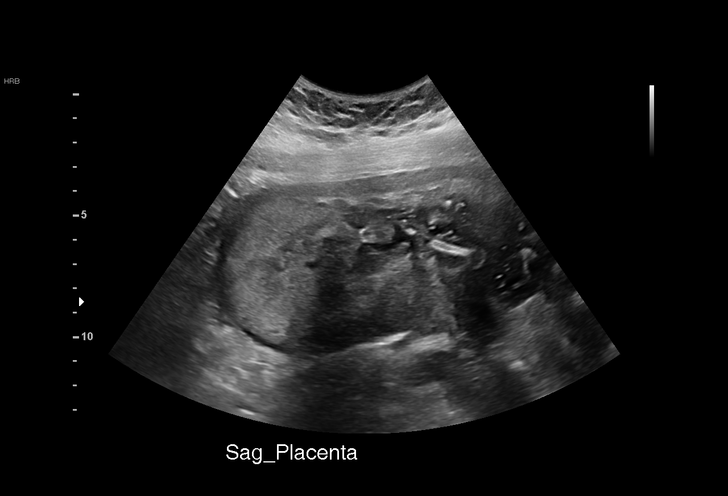
[im 7/15]
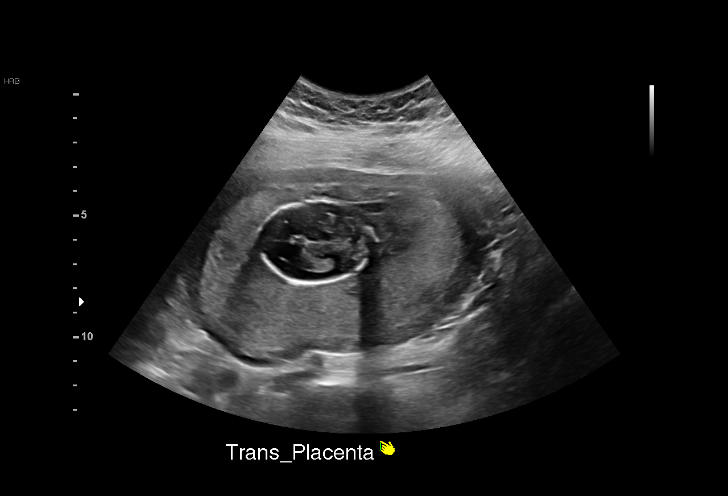
[im 9/15]
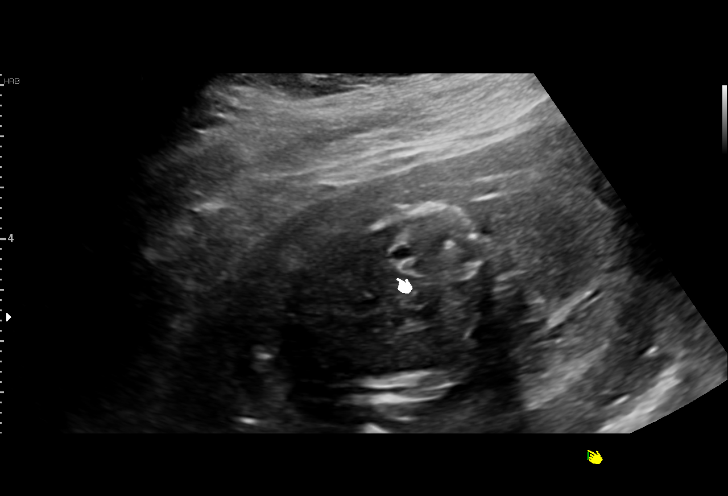
[im 10/15]
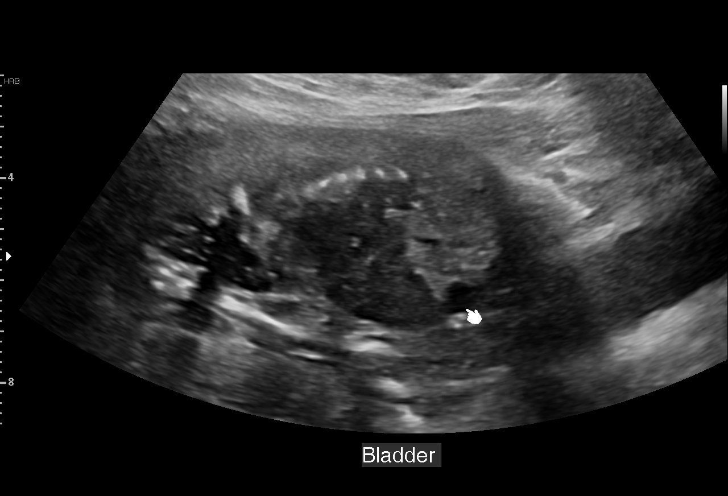
[im 11/15]
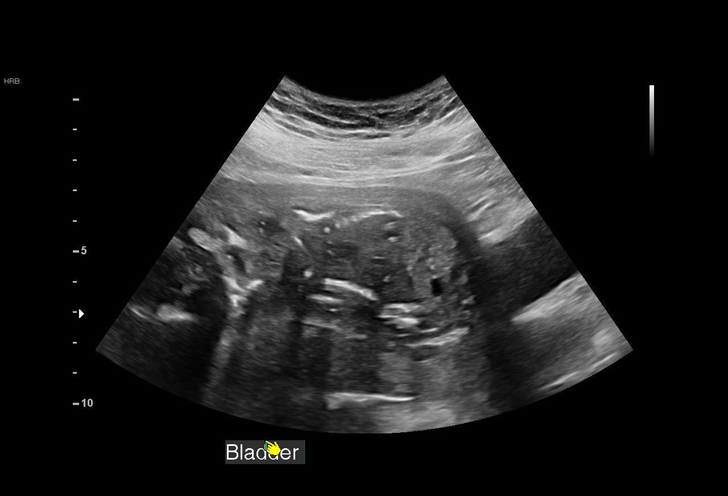
[im 12/15]
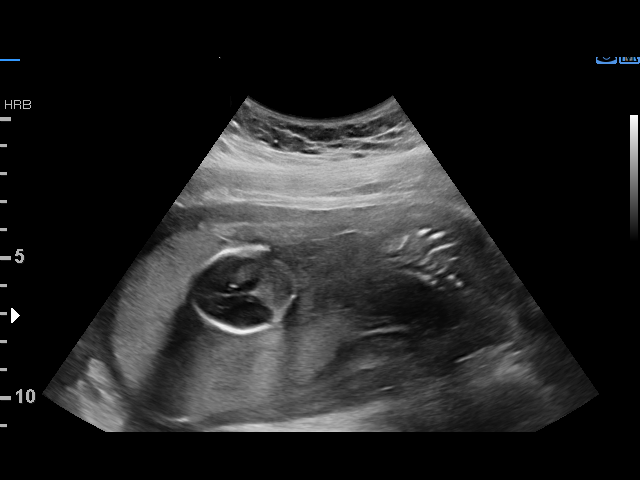
[im 13/15]
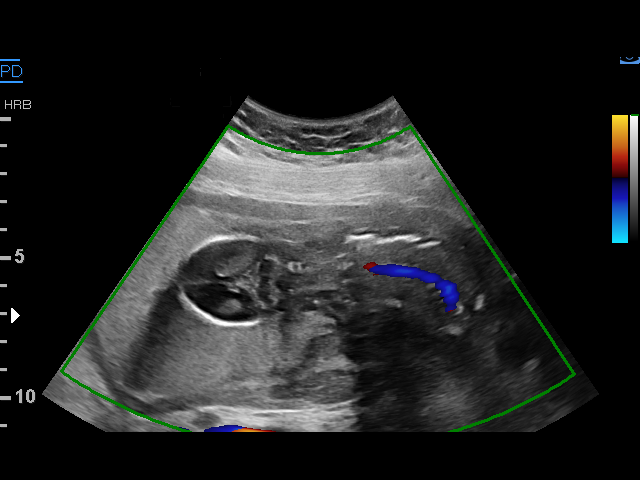
[im 14/15]
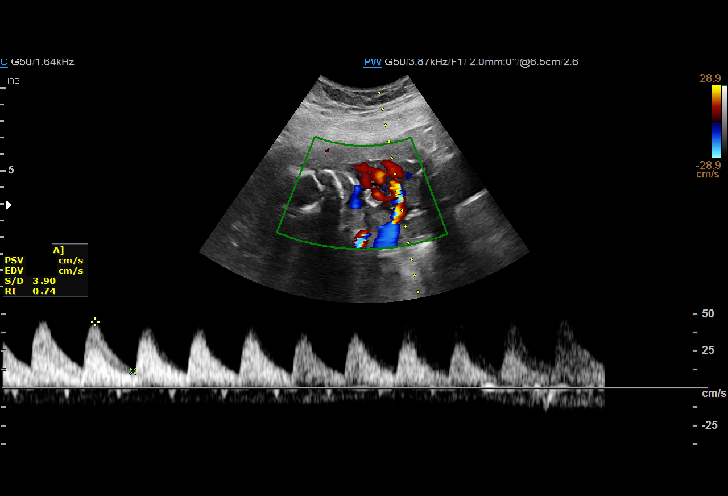
[im 15/15]
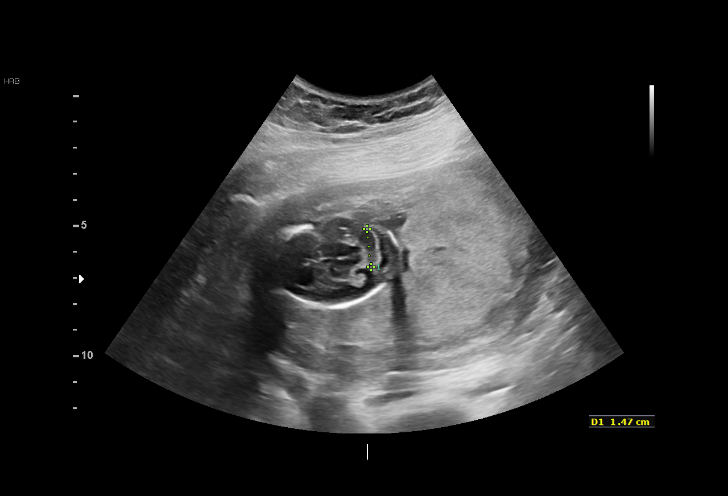

[14 of 15 positions shown; findings below may reference images not displayed]

Indications

 Encounter for antenatal screening for other
 genetic defects
 Premature rupture of membranes - leaking
 fluid
 17 weeks gestation of pregnancy
Fetal Evaluation

 Num Of Fetuses:         1
 Fetal Heart Rate(bpm):  158
 Cardiac Activity:       Observed
 Presentation:           Breech
 Placenta:               Posterior Fundal

 Amniotic Fluid
 AFI FV:      Oligohydramnios

                             Largest Pocket(cm)
                             1
OB History

 Gravidity:    1         Term:   0        Prem:   0        SAB:   0
 TOP:          0       Ectopic:  0        Living: 0
Gestational Age

 LMP:           17w 5d        Date:  10/22/19                 EDD:   07/28/20
 Best:          17w 5d     Det. By:  LMP  (10/22/19)          EDD:   07/28/20
Anatomy
 Stomach:               Appears normal, left   Bladder:                Appears normal
                        sided
Impression

 Rupture of membranes in early pregnancy (13 to 14 weeks).
 Her pregnancy is dated by her LMP date (1 week discrepancy
 with CRL).
 bleeding. She reports occasional pain in the right lower
 abdomen. No clear tenderness was elicited on palpation.
 Her partner was present during ultrasound and counseling.
 On ultrasound, severe oligohydramnios is seen (1 cm
 pocket). Breech presentation. Good fetal heart activity is
 seen. Cerebellum appears normal. Fetal anatomical survey is
 very limited.
 I counseled the couple early rupture of membranes is
 associated with a high incidence of spontaneous miscarriage.
 Maternal sepsis can develop if intraamniotic infection occurs.
 I educated the patient on the symptoms and signs of infection
 and advised her to come to the [facility] if she develops
 symptoms.
 Pulmonary hypoplasia can develop in up to 50% of cases and
 it will not be evident till after birth.
 Our protocol is to offer admission at 23 to 24 weeks'
 gestation and manage as inpatient till delivery. Steroids
 (Ebster) will be administered at 23 weeks' gestation.
 If patient prefers to wait till 28 weeks to improve neonatal
 outcomes, she may be managed as an outpatient till 28
 weeks' gestation.
 Delivery may be recommended if fetal heart trace is
 nonreassuring. Classical cesarean is likely with the possibility
 of poor neonatal outcome.
 Other complications include placental abruption, cord
 prolapse, stillbirth and limb contractures (transient).
 Patient informed she will continue her pregnancy and will not
 undergo termination of pregnancy unless infection develops.
 Amnioinfusion is not recommended and has not shown to
 improve neonatal outcomes. Frequent amnioinfusion is likely
 to increase risk of intraamniotic infection and placental
 abruption.
Recommendations

 -An appointment was made for her to return in 4 weeks for
 detailed fetal anatomy scan (if feasible).
 -Routine prenatal care. Patient has a new Ob appointment on
 03/02/20.
 -If fetus is still alive, we will counsel inpatient management
 from 24 weeks (to take into account of possible error in
 gestational age).
                 [place], Suyanti

## 2021-10-16 DIAGNOSIS — W540XXA Bitten by dog, initial encounter: Secondary | ICD-10-CM | POA: Diagnosis not present

## 2021-10-16 DIAGNOSIS — Z23 Encounter for immunization: Secondary | ICD-10-CM | POA: Diagnosis not present

## 2021-10-16 DIAGNOSIS — S61250A Open bite of right index finger without damage to nail, initial encounter: Secondary | ICD-10-CM | POA: Diagnosis not present

## 2021-10-16 DIAGNOSIS — S61252A Open bite of right middle finger without damage to nail, initial encounter: Secondary | ICD-10-CM | POA: Diagnosis not present

## 2021-10-16 DIAGNOSIS — S61451A Open bite of right hand, initial encounter: Secondary | ICD-10-CM | POA: Diagnosis not present

## 2022-04-27 DIAGNOSIS — L237 Allergic contact dermatitis due to plants, except food: Secondary | ICD-10-CM | POA: Diagnosis not present

## 2022-04-27 DIAGNOSIS — Z6826 Body mass index (BMI) 26.0-26.9, adult: Secondary | ICD-10-CM | POA: Diagnosis not present

## 2022-04-30 DIAGNOSIS — Z6826 Body mass index (BMI) 26.0-26.9, adult: Secondary | ICD-10-CM | POA: Diagnosis not present

## 2022-04-30 DIAGNOSIS — L255 Unspecified contact dermatitis due to plants, except food: Secondary | ICD-10-CM | POA: Diagnosis not present

## 2022-10-02 DIAGNOSIS — F329 Major depressive disorder, single episode, unspecified: Secondary | ICD-10-CM | POA: Diagnosis not present

## 2022-10-03 DIAGNOSIS — F329 Major depressive disorder, single episode, unspecified: Secondary | ICD-10-CM | POA: Diagnosis not present

## 2022-10-31 DIAGNOSIS — F329 Major depressive disorder, single episode, unspecified: Secondary | ICD-10-CM | POA: Diagnosis not present

## 2023-04-14 DIAGNOSIS — F329 Major depressive disorder, single episode, unspecified: Secondary | ICD-10-CM | POA: Diagnosis not present

## 2023-04-16 DIAGNOSIS — F329 Major depressive disorder, single episode, unspecified: Secondary | ICD-10-CM | POA: Diagnosis not present

## 2023-04-17 DIAGNOSIS — F329 Major depressive disorder, single episode, unspecified: Secondary | ICD-10-CM | POA: Diagnosis not present

## 2023-04-21 DIAGNOSIS — F329 Major depressive disorder, single episode, unspecified: Secondary | ICD-10-CM | POA: Diagnosis not present

## 2023-04-23 DIAGNOSIS — F329 Major depressive disorder, single episode, unspecified: Secondary | ICD-10-CM | POA: Diagnosis not present

## 2023-04-28 DIAGNOSIS — F329 Major depressive disorder, single episode, unspecified: Secondary | ICD-10-CM | POA: Diagnosis not present

## 2023-04-30 DIAGNOSIS — F329 Major depressive disorder, single episode, unspecified: Secondary | ICD-10-CM | POA: Diagnosis not present

## 2023-05-05 DIAGNOSIS — F329 Major depressive disorder, single episode, unspecified: Secondary | ICD-10-CM | POA: Diagnosis not present

## 2023-05-07 DIAGNOSIS — F329 Major depressive disorder, single episode, unspecified: Secondary | ICD-10-CM | POA: Diagnosis not present

## 2023-05-08 DIAGNOSIS — F329 Major depressive disorder, single episode, unspecified: Secondary | ICD-10-CM | POA: Diagnosis not present

## 2023-05-12 DIAGNOSIS — F329 Major depressive disorder, single episode, unspecified: Secondary | ICD-10-CM | POA: Diagnosis not present

## 2023-05-14 DIAGNOSIS — Z7151 Drug abuse counseling and surveillance of drug abuser: Secondary | ICD-10-CM | POA: Diagnosis not present

## 2023-05-14 DIAGNOSIS — F329 Major depressive disorder, single episode, unspecified: Secondary | ICD-10-CM | POA: Diagnosis not present

## 2023-05-15 DIAGNOSIS — F329 Major depressive disorder, single episode, unspecified: Secondary | ICD-10-CM | POA: Diagnosis not present

## 2023-05-21 DIAGNOSIS — F329 Major depressive disorder, single episode, unspecified: Secondary | ICD-10-CM | POA: Diagnosis not present

## 2023-05-26 DIAGNOSIS — F329 Major depressive disorder, single episode, unspecified: Secondary | ICD-10-CM | POA: Diagnosis not present

## 2023-05-28 DIAGNOSIS — F329 Major depressive disorder, single episode, unspecified: Secondary | ICD-10-CM | POA: Diagnosis not present

## 2023-06-04 DIAGNOSIS — F329 Major depressive disorder, single episode, unspecified: Secondary | ICD-10-CM | POA: Diagnosis not present

## 2023-06-09 DIAGNOSIS — F329 Major depressive disorder, single episode, unspecified: Secondary | ICD-10-CM | POA: Diagnosis not present

## 2023-06-11 DIAGNOSIS — F329 Major depressive disorder, single episode, unspecified: Secondary | ICD-10-CM | POA: Diagnosis not present

## 2023-06-16 DIAGNOSIS — F329 Major depressive disorder, single episode, unspecified: Secondary | ICD-10-CM | POA: Diagnosis not present

## 2023-06-18 DIAGNOSIS — F329 Major depressive disorder, single episode, unspecified: Secondary | ICD-10-CM | POA: Diagnosis not present

## 2023-06-30 DIAGNOSIS — F329 Major depressive disorder, single episode, unspecified: Secondary | ICD-10-CM | POA: Diagnosis not present

## 2023-07-02 DIAGNOSIS — F329 Major depressive disorder, single episode, unspecified: Secondary | ICD-10-CM | POA: Diagnosis not present

## 2023-07-07 DIAGNOSIS — F329 Major depressive disorder, single episode, unspecified: Secondary | ICD-10-CM | POA: Diagnosis not present

## 2023-07-09 DIAGNOSIS — F329 Major depressive disorder, single episode, unspecified: Secondary | ICD-10-CM | POA: Diagnosis not present

## 2023-07-09 DIAGNOSIS — Z7151 Drug abuse counseling and surveillance of drug abuser: Secondary | ICD-10-CM | POA: Diagnosis not present

## 2023-07-14 DIAGNOSIS — F329 Major depressive disorder, single episode, unspecified: Secondary | ICD-10-CM | POA: Diagnosis not present

## 2023-07-16 DIAGNOSIS — F329 Major depressive disorder, single episode, unspecified: Secondary | ICD-10-CM | POA: Diagnosis not present

## 2023-07-17 DIAGNOSIS — Z7151 Drug abuse counseling and surveillance of drug abuser: Secondary | ICD-10-CM | POA: Diagnosis not present

## 2023-07-17 DIAGNOSIS — F329 Major depressive disorder, single episode, unspecified: Secondary | ICD-10-CM | POA: Diagnosis not present

## 2023-07-21 DIAGNOSIS — F329 Major depressive disorder, single episode, unspecified: Secondary | ICD-10-CM | POA: Diagnosis not present

## 2023-07-23 DIAGNOSIS — F329 Major depressive disorder, single episode, unspecified: Secondary | ICD-10-CM | POA: Diagnosis not present

## 2023-07-28 DIAGNOSIS — M9904 Segmental and somatic dysfunction of sacral region: Secondary | ICD-10-CM | POA: Diagnosis not present

## 2023-07-28 DIAGNOSIS — M5386 Other specified dorsopathies, lumbar region: Secondary | ICD-10-CM | POA: Diagnosis not present

## 2023-07-28 DIAGNOSIS — M9903 Segmental and somatic dysfunction of lumbar region: Secondary | ICD-10-CM | POA: Diagnosis not present

## 2023-07-28 DIAGNOSIS — F329 Major depressive disorder, single episode, unspecified: Secondary | ICD-10-CM | POA: Diagnosis not present

## 2023-07-28 DIAGNOSIS — M9905 Segmental and somatic dysfunction of pelvic region: Secondary | ICD-10-CM | POA: Diagnosis not present

## 2023-07-30 DIAGNOSIS — M5386 Other specified dorsopathies, lumbar region: Secondary | ICD-10-CM | POA: Diagnosis not present

## 2023-07-30 DIAGNOSIS — M9903 Segmental and somatic dysfunction of lumbar region: Secondary | ICD-10-CM | POA: Diagnosis not present

## 2023-07-30 DIAGNOSIS — M9904 Segmental and somatic dysfunction of sacral region: Secondary | ICD-10-CM | POA: Diagnosis not present

## 2023-07-30 DIAGNOSIS — M9905 Segmental and somatic dysfunction of pelvic region: Secondary | ICD-10-CM | POA: Diagnosis not present

## 2023-07-30 DIAGNOSIS — F329 Major depressive disorder, single episode, unspecified: Secondary | ICD-10-CM | POA: Diagnosis not present

## 2023-08-01 DIAGNOSIS — F329 Major depressive disorder, single episode, unspecified: Secondary | ICD-10-CM | POA: Diagnosis not present

## 2023-08-04 DIAGNOSIS — M9904 Segmental and somatic dysfunction of sacral region: Secondary | ICD-10-CM | POA: Diagnosis not present

## 2023-08-04 DIAGNOSIS — F329 Major depressive disorder, single episode, unspecified: Secondary | ICD-10-CM | POA: Diagnosis not present

## 2023-08-04 DIAGNOSIS — M9903 Segmental and somatic dysfunction of lumbar region: Secondary | ICD-10-CM | POA: Diagnosis not present

## 2023-08-04 DIAGNOSIS — M9905 Segmental and somatic dysfunction of pelvic region: Secondary | ICD-10-CM | POA: Diagnosis not present

## 2023-08-04 DIAGNOSIS — M5386 Other specified dorsopathies, lumbar region: Secondary | ICD-10-CM | POA: Diagnosis not present

## 2023-08-05 ENCOUNTER — Ambulatory Visit: Payer: Medicaid Other | Admitting: Family Medicine

## 2023-08-06 DIAGNOSIS — F329 Major depressive disorder, single episode, unspecified: Secondary | ICD-10-CM | POA: Diagnosis not present

## 2023-08-08 ENCOUNTER — Encounter (HOSPITAL_COMMUNITY): Payer: Self-pay

## 2023-08-08 ENCOUNTER — Other Ambulatory Visit: Payer: Self-pay

## 2023-08-08 ENCOUNTER — Emergency Department (HOSPITAL_COMMUNITY)
Admission: EM | Admit: 2023-08-08 | Discharge: 2023-08-09 | Disposition: A | Discharge status: Home / Self Care | Payer: Medicaid Other | Attending: Emergency Medicine | Admitting: Emergency Medicine

## 2023-08-08 DIAGNOSIS — R109 Unspecified abdominal pain: Secondary | ICD-10-CM | POA: Diagnosis present

## 2023-08-08 DIAGNOSIS — R9431 Abnormal electrocardiogram [ECG] [EKG]: Secondary | ICD-10-CM | POA: Diagnosis not present

## 2023-08-08 DIAGNOSIS — K8050 Calculus of bile duct without cholangitis or cholecystitis without obstruction: Secondary | ICD-10-CM

## 2023-08-08 DIAGNOSIS — K8020 Calculus of gallbladder without cholecystitis without obstruction: Secondary | ICD-10-CM | POA: Diagnosis not present

## 2023-08-08 DIAGNOSIS — R7401 Elevation of levels of liver transaminase levels: Secondary | ICD-10-CM | POA: Diagnosis not present

## 2023-08-08 LAB — COMPREHENSIVE METABOLIC PANEL
ALT: 435 U/L — ABNORMAL HIGH (ref 0–44)
AST: 518 U/L — ABNORMAL HIGH (ref 15–41)
Albumin: 4.1 g/dL (ref 3.5–5.0)
Alkaline Phosphatase: 68 U/L (ref 38–126)
Anion gap: 13 (ref 5–15)
BUN: 7 mg/dL (ref 6–20)
CO2: 25 mmol/L (ref 22–32)
Calcium: 9.2 mg/dL (ref 8.9–10.3)
Chloride: 101 mmol/L (ref 98–111)
Creatinine, Ser: 0.71 mg/dL (ref 0.44–1.00)
GFR, Estimated: >60 mL/min (ref >60)
Glucose, Bld: 130 mg/dL — ABNORMAL HIGH (ref 70–99)
Potassium: 3.6 mmol/L (ref 3.5–5.1)
Sodium: 139 mmol/L (ref 135–145)
Total Bilirubin: 4.2 mg/dL — ABNORMAL HIGH (ref 0.3–1.2)
Total Protein: 8.4 g/dL — ABNORMAL HIGH (ref 6.5–8.1)

## 2023-08-08 LAB — CBC
HCT: 46.7 % — ABNORMAL HIGH (ref 36.0–46.0)
Hemoglobin: 16.4 g/dL — ABNORMAL HIGH (ref 12.0–15.0)
MCH: 33.3 pg (ref 26.0–34.0)
MCHC: 35.1 g/dL (ref 30.0–36.0)
MCV: 94.9 fL (ref 80.0–100.0)
Platelets: 255 10*3/uL (ref 150–400)
RBC: 4.92 MIL/uL (ref 3.87–5.11)
RDW: 11.7 % (ref 11.5–15.5)
WBC: 12.7 10*3/uL — ABNORMAL HIGH (ref 4.0–10.5)
nRBC: 0 % (ref 0.0–0.2)

## 2023-08-08 LAB — HCG, SERUM, QUALITATIVE: Preg, Serum: NEGATIVE

## 2023-08-08 LAB — LIPASE, BLOOD: Lipase: 39 U/L (ref 11–51)

## 2023-08-08 MED ORDER — HYDROMORPHONE HCL 1 MG/ML IJ SOLN
1.0000 mg | Freq: Once | INTRAMUSCULAR | Status: AC
  Administered 2023-08-09: 1 mg via INTRAVENOUS
  Filled 2023-08-08: qty 1

## 2023-08-08 MED ORDER — METOCLOPRAMIDE HCL 5 MG/ML IJ SOLN
10.0000 mg | INTRAMUSCULAR | Status: AC
  Administered 2023-08-09: 10 mg via INTRAVENOUS
  Filled 2023-08-08: qty 2

## 2023-08-08 MED ORDER — FAMOTIDINE IN NACL 20-0.9 MG/50ML-% IV SOLN
20.0000 mg | Freq: Once | INTRAVENOUS | Status: AC
  Administered 2023-08-09: 20 mg via INTRAVENOUS
  Filled 2023-08-08: qty 50

## 2023-08-08 MED ORDER — FENTANYL CITRATE PF 50 MCG/ML IJ SOSY
50.0000 ug | PREFILLED_SYRINGE | Freq: Once | INTRAMUSCULAR | Status: DC
  Filled 2023-08-08: qty 1

## 2023-08-08 NOTE — ED Triage Notes (Signed)
Patient has ripping epigastric pain with labored breathing and difficulty taking deep breaths. Patient reports this started after she threw up. 8/10 pain in triage. Patient also reports that she vomits after heavy meals regularly and lifts heavy objects at work.

## 2023-08-08 NOTE — ED Provider Notes (Incomplete)
[place] EMERGENCY DEPARTMENT AT [facility] Provider Note   CSN: [id number] Arrival date & time: 08/08/23  2235     History {Add pertinent medical, surgical, social history, OB history to HPI:1} Chief Complaint  Patient presents with  . Abdominal Pain    Latoya Taylor is a 27 y.o. female.  The history is provided by the patient. No language interpreter was used.  Abdominal Pain      Home Medications Prior to Admission medications   Medication Sig Start Date End Date Taking? Authorizing Provider  ibuprofen (ADVIL) 600 MG tablet Take 1 tablet (600 mg total) by mouth every 6 (six) hours. 03/25/20   Hermina Staggers, MD  NIFEdipine (PROCARDIA-XL/NIFEDICAL-XL) 30 MG 24 hr tablet Take 1 tablet (30 mg total) by mouth daily. Can increase to twice a day as needed for symptomatic contractions 04/20/20   Conan Bowens, MD  norethindrone-ethinyl estradiol (LOESTRIN 1/20, 21,) 1-20 MG-MCG tablet Take 1 tablet by mouth daily. Begin 2 wks after delivery Patient not taking: Reported on 04/20/2020 03/25/20   Reva Bores, MD  norgestimate-ethinyl estradiol (ORTHO-CYCLEN) 0.25-35 MG-MCG tablet Take 1 tablet by mouth daily. Patient not taking: Reported on 04/20/2020 04/05/20   Conan Bowens, MD  oxyCODONE-acetaminophen (PERCOCET/ROXICET) 5-325 MG tablet Take 1 tablet by mouth every 4 (four) hours as needed (pain scale 4-7). Patient not taking: Reported on 04/20/2020 03/25/20   Hermina Staggers, MD  Prenatal Vit-Fe Fumarate-FA (PRENATAL MULTIVITAMIN) TABS tablet Take 1 tablet by mouth daily at 12 noon.    [provider]      Allergies    Patient has no known allergies.    Review of Systems   Review of Systems  Gastrointestinal:  Positive for abdominal pain.    Physical Exam Updated Vital Signs BP 130/86 (BP Location: Right Arm)   Pulse 90   Temp 97.9 F (36.6 C) (Oral)   Resp (!) 26   Ht 4\' 8"  (1.422 m)   Wt 61.2 kg   SpO2 97%   BMI 30.27 kg/m  Physical Exam  ED  Results / Procedures / Treatments   Labs (all labs ordered are listed, but only abnormal results are displayed) Labs Reviewed  COMPREHENSIVE METABOLIC PANEL - Abnormal; Notable for the following components:      Result Value   Glucose, Bld 130 (*)    Total Protein 8.4 (*)    AST 518 (*)    ALT 435 (*)    Total Bilirubin 4.2 (*)    All other components within normal limits  CBC - Abnormal; Notable for the following components:   WBC 12.7 (*)    Hemoglobin 16.4 (*)    HCT 46.7 (*)    All other components within normal limits  LIPASE, BLOOD  HCG, SERUM, QUALITATIVE  URINALYSIS, ROUTINE W REFLEX MICROSCOPIC  I-STAT CHEM 8, ED    EKG None  Radiology No results found.  Procedures Procedures  {Document cardiac monitor, telemetry assessment procedure when appropriate:1}  Medications Ordered in ED Medications  HYDROmorphone (DILAUDID) injection 1 mg (has no administration in time range)  famotidine (PEPCID) IVPB 20 mg premix (has no administration in time range)  metoCLOPramide (REGLAN) injection 10 mg (has no administration in time range)    ED Course/ Medical Decision Making/ A&P   {   Click here for ABCD2, HEART and other calculatorsREFRESH Note before signing :1}  Medical Decision Making Amount and/or Complexity of Data Reviewed Radiology: ordered.  Risk Prescription drug management.   ***  {Document critical care time when appropriate:1} {Document review of labs and clinical decision tools ie heart score, Chads2Vasc2 etc:1}  {Document your independent review of radiology images, and any outside records:1} {Document your discussion with family members, caretakers, and with consultants:1} {Document social determinants of health affecting pt's care:1} {Document your decision making why or why not admission, treatments were needed:1} Final Clinical Impression(s) / ED Diagnoses Final diagnoses:  None    Rx / DC Orders ED Discharge  Orders     None

## 2023-08-08 NOTE — ED Provider Notes (Signed)
[place] EMERGENCY DEPARTMENT AT [facility] Provider Note   CSN: [id number] Arrival date & time: 08/08/23  2235     History  Chief Complaint  Patient presents with   Abdominal Pain    Latoya Taylor is a 27 y.o. female.  27 year old female with no known PMH presents to the emergency department for evaluation of epigastric pain.  She states that pain is constant and "ripping" in nature.  It is hard to take a deep breath secondary to persistent symptoms.  She states that her pain began this evening after eating a heavy meal.  She has a history of vomiting after "heavier meal", such as when eating a burrito.  Denies questionable food ingestion, sick contacts, recent fevers. She has not taken any medications for her symptoms. No prior abdominal surgeries.  The history is provided by the patient. No language interpreter was used.  Abdominal Pain      Home Medications Prior to Admission medications   Medication Sig Start Date End Date Taking? Authorizing Provider  ibuprofen (ADVIL) 600 MG tablet Take 1 tablet (600 mg total) by mouth every 6 (six) hours. 03/25/20   Hermina Staggers, MD  NIFEdipine (PROCARDIA-XL/NIFEDICAL-XL) 30 MG 24 hr tablet Take 1 tablet (30 mg total) by mouth daily. Can increase to twice a day as needed for symptomatic contractions 04/20/20   Conan Bowens, MD  norethindrone-ethinyl estradiol (LOESTRIN 1/20, 21,) 1-20 MG-MCG tablet Take 1 tablet by mouth daily. Begin 2 wks after delivery Patient not taking: Reported on 04/20/2020 03/25/20   Reva Bores, MD  norgestimate-ethinyl estradiol (ORTHO-CYCLEN) 0.25-35 MG-MCG tablet Take 1 tablet by mouth daily. Patient not taking: Reported on 04/20/2020 04/05/20   Conan Bowens, MD  oxyCODONE-acetaminophen (PERCOCET/ROXICET) 5-325 MG tablet Take 1 tablet by mouth every 4 (four) hours as needed (pain scale 4-7). Patient not taking: Reported on 04/20/2020 03/25/20   Hermina Staggers, MD  Prenatal Vit-Fe Fumarate-FA  (PRENATAL MULTIVITAMIN) TABS tablet Take 1 tablet by mouth daily at 12 noon.    [provider]      Allergies    Patient has no known allergies.    Review of Systems   Review of Systems  Gastrointestinal:  Positive for abdominal pain.  Ten systems reviewed and are negative for acute change, except as noted in the HPI.    Physical Exam Updated Vital Signs BP 130/86 (BP Location: Right Arm)   Pulse 90   Temp 97.9 F (36.6 C) (Oral)   Resp (!) 26   Ht 4\' 8"  (1.422 m)   Wt 61.2 kg   SpO2 97%   BMI 30.27 kg/m   Physical Exam Vitals and nursing note reviewed.  Constitutional:      General: She is not in acute distress.    Appearance: She is well-developed. She is not diaphoretic.     Comments: Appears uncomfortable; wincing. Nontoxic appearing.  HENT:     Head: Normocephalic and atraumatic.  Eyes:     General: No scleral icterus.    Extraocular Movements: EOM normal.     Conjunctiva/sclera: Conjunctivae normal.  Cardiovascular:     Rate and Rhythm: Normal rate and regular rhythm.     Pulses: Normal pulses.  Pulmonary:     Effort: Pulmonary effort is normal. No respiratory distress.     Breath sounds: No stridor. No wheezing.     Comments: Respirations even and unlabored. Lungs CTAB. Abdominal:     Palpations: Abdomen is soft. There is no  mass.     Tenderness: There is abdominal tenderness (epigastric). There is guarding.     Comments: No peritoneal signs.  Musculoskeletal:        General: Normal range of motion.     Cervical back: Normal range of motion.  Skin:    General: Skin is warm and dry.     Coloration: Skin is not pale.     Findings: No erythema or rash.  Neurological:     Mental Status: She is alert and oriented to person, place, and time.     Coordination: Coordination normal.  Psychiatric:        Mood and Affect: Mood and affect normal.        Behavior: Behavior normal.     ED Results / Procedures / Treatments   Labs (all labs ordered  are listed, but only abnormal results are displayed) Labs Reviewed  COMPREHENSIVE METABOLIC PANEL - Abnormal; Notable for the following components:      Result Value   Glucose, Bld 130 (*)    Total Protein 8.4 (*)    AST 518 (*)    ALT 435 (*)    Total Bilirubin 4.2 (*)    All other components within normal limits  CBC - Abnormal; Notable for the following components:   WBC 12.7 (*)    Hemoglobin 16.4 (*)    HCT 46.7 (*)    All other components within normal limits  URINALYSIS, ROUTINE W REFLEX MICROSCOPIC - Abnormal; Notable for the following components:   Color, Urine AMBER (*)    APPearance HAZY (*)    Specific Gravity, Urine >1.046 (*)    Hgb urine dipstick MODERATE (*)    Ketones, ur 5 (*)    Leukocytes,Ua SMALL (*)    Bacteria, UA FEW (*)    All other components within normal limits  LIPASE, BLOOD  HCG, SERUM, QUALITATIVE  I-STAT CHEM 8, ED    EKG EKG Interpretation Date/Time:  Saturday August 09 2023 00:02:52 EDT Ventricular Rate:  88 PR Interval:  137 QRS Duration:  95 QT Interval:  373 QTC Calculation: 452 R Axis:   83  Text Interpretation: Sinus rhythm No significant change was found Confirmed by Glynn Octave [phone]) on 08/09/2023 12:06:07 AM  Radiology US Abdomen Limited  Result Date: 08/09/2023 CLINICAL DATA:  Right upper quadrant pain, epigastric pain, and shortness of breath with vomiting. EXAM: ULTRASOUND ABDOMEN LIMITED RIGHT UPPER QUADRANT COMPARISON:  None Available. FINDINGS: Gallbladder: Multiple stones are demonstrated in the gallbladder, measuring up to 1.4 cm diameter. Layering sludge is present. No gallbladder wall thickening or edema. Murphy's sign is negative. Common bile duct: Diameter: 7 mm, normal. Liver: No focal lesion identified. Within normal limits in parenchymal echogenicity. Portal vein is patent on color Doppler imaging with normal direction of blood flow towards the liver. Other: None. IMPRESSION: Cholelithiasis without  additional changes to suggest acute cholecystitis. Electronically Signed   By: Burman Nieves M.D.   On: 08/09/2023 00:54    CT Angio Chest/Abd/Pel for Dissection W and/or Wo Contrast  Result Date: 08/09/2023 CLINICAL DATA:  Aortic aneurysm suspected. Ripping epigastric pain. Labored breathing and difficulty taking deep breaths. Pain started after vomiting. EXAM: CT ANGIOGRAPHY CHEST, ABDOMEN AND PELVIS TECHNIQUE: Non-contrast CT of the chest was initially obtained. Multidetector CT imaging through the chest, abdomen and pelvis was performed using the standard protocol during bolus administration of intravenous contrast. Multiplanar reconstructed images and MIPs were obtained and reviewed to evaluate the vascular anatomy. RADIATION DOSE  REDUCTION: This exam was performed according to the departmental dose-optimization program which includes automated exposure control, adjustment of the mA and/or kV according to patient size and/or use of iterative reconstruction technique. CONTRAST:  75mL OMNIPAQUE IOHEXOL 350 MG/ML SOLN COMPARISON:  None Available. FINDINGS: CTA CHEST FINDINGS Cardiovascular: Unenhanced images of the chest demonstrate no evidence of intramural hematoma. No significant vascular calcifications. Images obtained during arterial phase after intravenous contrast material administration demonstrates normal caliber thoracic aorta. No aortic aneurysm or dissection. Great vessel origins are patent. Central pulmonary arteries are patent. No evidence of significant pulmonary embolus. Normal heart size. No pericardial effusions. Mediastinum/Nodes: No enlarged mediastinal, hilar, or axillary lymph nodes. Thyroid gland, trachea, and esophagus demonstrate no significant findings. No mediastinal gas or fluid collections. Lungs/Pleura: Lungs are clear. No pleural effusion or pneumothorax. Musculoskeletal: No chest wall abnormality. No acute or significant osseous findings. Review of the MIP images confirms  the above findings. CTA ABDOMEN AND PELVIS FINDINGS VASCULAR Aorta: Normal caliber aorta without aneurysm, dissection, vasculitis or significant stenosis. Celiac: Patent without evidence of aneurysm, dissection, vasculitis or significant stenosis. SMA: Patent without evidence of aneurysm, dissection, vasculitis or significant stenosis. Renals: Both renal arteries are patent without evidence of aneurysm, dissection, vasculitis, fibromuscular dysplasia or significant stenosis. IMA: Patent without evidence of aneurysm, dissection, vasculitis or significant stenosis. Inflow: Patent without evidence of aneurysm, dissection, vasculitis or significant stenosis. Veins: No obvious venous abnormality within the limitations of this arterial phase study. Review of the MIP images confirms the above findings. NON-VASCULAR Hepatobiliary: No focal liver lesions. Somewhat heterogeneous appearance of the gallbladder possibly representing stones. No wall thickening or inflammatory infiltration. No bile duct dilatation. Pancreas: Unremarkable. No pancreatic ductal dilatation or surrounding inflammatory changes. Spleen: Normal in size without focal abnormality. Adrenals/Urinary Tract: Adrenal glands are unremarkable. Kidneys are normal, without renal calculi, focal lesion, or hydronephrosis. Bladder is unremarkable. Stomach/Bowel: Stomach, small bowel, and colon are not abnormally distended. No wall thickening or inflammatory changes. Appendix is not identified. Lymphatic: No significant lymphadenopathy. Reproductive: Uterus and bilateral adnexa are unremarkable. Other: No free air or free fluid in the abdomen. Abdominal wall musculature appears intact. Musculoskeletal: No acute or significant osseous findings. Review of the MIP images confirms the above findings. IMPRESSION: 1. Normal appearance of the thoracic and abdominal aorta. No evidence of aneurysm or dissection. 2. No evidence of esophageal or bowel perforation. No mediastinal  gas or fluid collections. 3. Lungs are clear. 4. No acute process demonstrated in the abdomen or pelvis. 5. Cholelithiasis without evidence of acute cholecystitis. Electronically Signed   By: Burman Nieves M.D.   On: 08/09/2023 01:10   Procedures Procedures    Medications Ordered in ED Medications  HYDROmorphone (DILAUDID) injection 1 mg (1 mg Intravenous Given 08/09/23 0004)  famotidine (PEPCID) IVPB 20 mg premix (20 mg Intravenous New Bag/Given 08/09/23 0012)  metoCLOPramide (REGLAN) injection 10 mg (10 mg Intravenous Given 08/09/23 0003)  iohexol (OMNIPAQUE) 350 MG/ML injection 75 mL (75 mLs Intravenous Contrast Given 08/09/23 0102)    ED Course/ Medical Decision Making/ A&P Clinical Course as of 08/22/23 1015  Sat Aug 09, 2023  0109 Ultrasound notable for cholelithiasis.  She does have a transaminitis and elevated bilirubin consistent with gallbladder pathology.  No gallbladder wall thickening or pericholecystic fluid noted on imaging.  She does have a CT scan pending to evaluate for other emergent cause of pain; specifically looking to exclude Boerhaave's given sudden onset of pain after vomiting. [KH]  0224 No other acute process noted  on CT scan.  The patient has been reassessed.  She is resting comfortably.  Still with some persistent right upper quadrant tenderness, but overall is feeling better.  Will fluid challenge and reassess. [KH]  0505 Feeling better. No pain. Tolerating PO w/o issue. [KH]    Clinical Course User Index [KH] Antony Madura, PA-C                                 Medical Decision Making Amount and/or Complexity of Data Reviewed Radiology: ordered.  Risk Prescription drug management.   This patient presents to the ED for concern of epigastric pain, this involves an extensive number of treatment options, and is a complaint that carries with it a high risk of complications and morbidity.  The differential diagnosis includes pancreatitis vs cholecystitis vs  choledocholithiasis vs PUD/gastritis vs esophageal spasm vs esophageal or duodenal perforation vs dissection vs viral illness   Co morbidities that complicate the patient evaluation  Hx inguinal hernia   Additional history obtained:  Additional history obtained from partner, at bedside External records from outside source obtained and reviewed including prior imaging which included MFM ultrasound in 2021.   Lab Tests:  I Ordered, and personally interpreted labs.  The pertinent results include:  WBC 12.7 (suspect related to stress/pain response). AST 518, ALT 435 and Tbili 4.2 (suspected 2/2 gallbladder pathology).    Imaging Studies ordered:  I ordered imaging studies including CTA dissection study; US abdomen RUQ  I independently visualized and interpreted imaging which showed cholelithiasis w/o evidence of cholecystitis. I agree with the radiologist interpretation   Cardiac Monitoring:  The patient was maintained on a cardiac monitor.  I personally viewed and interpreted the cardiac monitored which showed an underlying rhythm of: NSR   Medicines ordered and prescription drug management:  I ordered medication including Dilaudid and pepcid for pain, Reglan for nausea. Reevaluation of the patient after these medicines showed that the patient improved I have reviewed the patients home medicines and have made adjustments as needed   Test Considered:  HIDA scan   Problem List / ED Course:  As above Low suspicion for choledocholithiasis given resolution of pain. No fever or other concern for SIRS based on vitals and work up.   Reevaluation:  After the interventions noted above, I reevaluated the patient and found that they have :improved   Social Determinants of Health:  Good social support; partner at bedside.   Dispostion:  After consideration of the diagnostic results and the patients response to treatment, I feel that the patent would benefit from  outpatient f/u with general surgery. Will also need f/u in 1 week for trending of LFTs. Provided information on [facility] for this. Encouraged gallbladder eating plan. Return precautions discussed and provided. Patient discharged in stable condition with no unaddressed concerns.         Final Clinical Impression(s) / ED Diagnoses Final diagnoses:  Biliary colic  Transaminitis    Rx / DC Orders ED Discharge Orders     None         Antony Madura, PA-C 08/22/23 1023    Glynn Octave, MD 08/22/23 1422

## 2023-08-09 ENCOUNTER — Emergency Department (HOSPITAL_COMMUNITY): Payer: Medicaid Other

## 2023-08-09 LAB — URINALYSIS, ROUTINE W REFLEX MICROSCOPIC
Bilirubin Urine: NEGATIVE
Glucose, UA: NEGATIVE mg/dL
Ketones, ur: 5 mg/dL — AB
Nitrite: NEGATIVE
Protein, ur: NEGATIVE mg/dL
Specific Gravity, Urine: >1.046 — ABNORMAL HIGH (ref 1.005–1.030)
pH: 7 (ref 5.0–8.0)

## 2023-08-09 MED ORDER — METOCLOPRAMIDE HCL 10 MG PO TABS
10.0000 mg | ORAL_TABLET | Freq: Four times a day (QID) | ORAL | 0 refills | Status: AC | PRN
Start: 2023-08-09 — End: ?

## 2023-08-09 MED ORDER — DICYCLOMINE HCL 20 MG PO TABS
20.0000 mg | ORAL_TABLET | Freq: Two times a day (BID) | ORAL | 0 refills | Status: AC | PRN
Start: 2023-08-09 — End: ?

## 2023-08-09 MED ORDER — IOHEXOL 350 MG/ML SOLN
75.0000 mL | Freq: Once | INTRAVENOUS | Status: AC | PRN
  Administered 2023-08-09: 75 mL via INTRAVENOUS

## 2023-08-09 NOTE — ED Notes (Signed)
Completed po trials, no vomiting, pt denies pain at this time.

## 2023-08-09 NOTE — Discharge Instructions (Addendum)
We recommend Bentyl as prescribed for management of ongoing pain.  You have been prescribed Reglan to use for nausea management.  Try to avoid fried, fatty, greasy foods as this could aggravate your pain.  Follow-up with primary care to have your liver tests rechecked in approximately 1 week. We also advise follow up with general surgery to discuss elective cholecystectomy.  Return for new or concerning symptoms, especially if you develop worsening pain with fever.

## 2023-08-11 DIAGNOSIS — F329 Major depressive disorder, single episode, unspecified: Secondary | ICD-10-CM | POA: Diagnosis not present

## 2023-08-11 DIAGNOSIS — M5386 Other specified dorsopathies, lumbar region: Secondary | ICD-10-CM | POA: Diagnosis not present

## 2023-08-11 DIAGNOSIS — M9905 Segmental and somatic dysfunction of pelvic region: Secondary | ICD-10-CM | POA: Diagnosis not present

## 2023-08-11 DIAGNOSIS — M9903 Segmental and somatic dysfunction of lumbar region: Secondary | ICD-10-CM | POA: Diagnosis not present

## 2023-08-11 DIAGNOSIS — M9904 Segmental and somatic dysfunction of sacral region: Secondary | ICD-10-CM | POA: Diagnosis not present

## 2023-08-13 DIAGNOSIS — F329 Major depressive disorder, single episode, unspecified: Secondary | ICD-10-CM | POA: Diagnosis not present

## 2023-08-18 DIAGNOSIS — M9905 Segmental and somatic dysfunction of pelvic region: Secondary | ICD-10-CM | POA: Diagnosis not present

## 2023-08-18 DIAGNOSIS — M5386 Other specified dorsopathies, lumbar region: Secondary | ICD-10-CM | POA: Diagnosis not present

## 2023-08-18 DIAGNOSIS — M9904 Segmental and somatic dysfunction of sacral region: Secondary | ICD-10-CM | POA: Diagnosis not present

## 2023-08-18 DIAGNOSIS — F329 Major depressive disorder, single episode, unspecified: Secondary | ICD-10-CM | POA: Diagnosis not present

## 2023-08-18 DIAGNOSIS — M9903 Segmental and somatic dysfunction of lumbar region: Secondary | ICD-10-CM | POA: Diagnosis not present

## 2023-08-20 DIAGNOSIS — F329 Major depressive disorder, single episode, unspecified: Secondary | ICD-10-CM | POA: Diagnosis not present

## 2023-08-25 DIAGNOSIS — M9905 Segmental and somatic dysfunction of pelvic region: Secondary | ICD-10-CM | POA: Diagnosis not present

## 2023-08-25 DIAGNOSIS — M9904 Segmental and somatic dysfunction of sacral region: Secondary | ICD-10-CM | POA: Diagnosis not present

## 2023-08-25 DIAGNOSIS — M5386 Other specified dorsopathies, lumbar region: Secondary | ICD-10-CM | POA: Diagnosis not present

## 2023-08-25 DIAGNOSIS — F329 Major depressive disorder, single episode, unspecified: Secondary | ICD-10-CM | POA: Diagnosis not present

## 2023-08-25 DIAGNOSIS — M9903 Segmental and somatic dysfunction of lumbar region: Secondary | ICD-10-CM | POA: Diagnosis not present

## 2023-08-27 DIAGNOSIS — F329 Major depressive disorder, single episode, unspecified: Secondary | ICD-10-CM | POA: Diagnosis not present

## 2023-08-28 DIAGNOSIS — F329 Major depressive disorder, single episode, unspecified: Secondary | ICD-10-CM | POA: Diagnosis not present

## 2023-09-01 DIAGNOSIS — F329 Major depressive disorder, single episode, unspecified: Secondary | ICD-10-CM | POA: Diagnosis not present

## 2023-09-05 DIAGNOSIS — F329 Major depressive disorder, single episode, unspecified: Secondary | ICD-10-CM | POA: Diagnosis not present

## 2023-09-08 DIAGNOSIS — F329 Major depressive disorder, single episode, unspecified: Secondary | ICD-10-CM | POA: Diagnosis not present

## 2023-09-10 DIAGNOSIS — F329 Major depressive disorder, single episode, unspecified: Secondary | ICD-10-CM | POA: Diagnosis not present

## 2023-09-15 DIAGNOSIS — F329 Major depressive disorder, single episode, unspecified: Secondary | ICD-10-CM | POA: Diagnosis not present

## 2023-09-17 DIAGNOSIS — F329 Major depressive disorder, single episode, unspecified: Secondary | ICD-10-CM | POA: Diagnosis not present

## 2023-09-22 DIAGNOSIS — F329 Major depressive disorder, single episode, unspecified: Secondary | ICD-10-CM | POA: Diagnosis not present

## 2023-09-24 DIAGNOSIS — F329 Major depressive disorder, single episode, unspecified: Secondary | ICD-10-CM | POA: Diagnosis not present

## 2023-10-01 DIAGNOSIS — F329 Major depressive disorder, single episode, unspecified: Secondary | ICD-10-CM | POA: Diagnosis not present

## 2023-10-08 DIAGNOSIS — F329 Major depressive disorder, single episode, unspecified: Secondary | ICD-10-CM | POA: Diagnosis not present

## 2023-10-13 DIAGNOSIS — F329 Major depressive disorder, single episode, unspecified: Secondary | ICD-10-CM | POA: Diagnosis not present

## 2023-10-20 DIAGNOSIS — F329 Major depressive disorder, single episode, unspecified: Secondary | ICD-10-CM | POA: Diagnosis not present

## 2023-10-27 DIAGNOSIS — F329 Major depressive disorder, single episode, unspecified: Secondary | ICD-10-CM | POA: Diagnosis not present

## 2023-11-05 DIAGNOSIS — F329 Major depressive disorder, single episode, unspecified: Secondary | ICD-10-CM | POA: Diagnosis not present

## 2023-11-06 DIAGNOSIS — F329 Major depressive disorder, single episode, unspecified: Secondary | ICD-10-CM | POA: Diagnosis not present

## 2023-11-10 DIAGNOSIS — F329 Major depressive disorder, single episode, unspecified: Secondary | ICD-10-CM | POA: Diagnosis not present

## 2023-11-12 DIAGNOSIS — F329 Major depressive disorder, single episode, unspecified: Secondary | ICD-10-CM | POA: Diagnosis not present

## 2023-11-14 DIAGNOSIS — F329 Major depressive disorder, single episode, unspecified: Secondary | ICD-10-CM | POA: Diagnosis not present

## 2023-11-17 DIAGNOSIS — F329 Major depressive disorder, single episode, unspecified: Secondary | ICD-10-CM | POA: Diagnosis not present

## 2023-11-19 DIAGNOSIS — F329 Major depressive disorder, single episode, unspecified: Secondary | ICD-10-CM | POA: Diagnosis not present

## 2023-11-24 DIAGNOSIS — F329 Major depressive disorder, single episode, unspecified: Secondary | ICD-10-CM | POA: Diagnosis not present

## 2023-11-26 DIAGNOSIS — F329 Major depressive disorder, single episode, unspecified: Secondary | ICD-10-CM | POA: Diagnosis not present

## 2023-12-02 DIAGNOSIS — F329 Major depressive disorder, single episode, unspecified: Secondary | ICD-10-CM | POA: Diagnosis not present

## 2023-12-03 DIAGNOSIS — F329 Major depressive disorder, single episode, unspecified: Secondary | ICD-10-CM | POA: Diagnosis not present

## 2023-12-08 DIAGNOSIS — F329 Major depressive disorder, single episode, unspecified: Secondary | ICD-10-CM | POA: Diagnosis not present

## 2023-12-10 DIAGNOSIS — F329 Major depressive disorder, single episode, unspecified: Secondary | ICD-10-CM | POA: Diagnosis not present

## 2023-12-15 DIAGNOSIS — F329 Major depressive disorder, single episode, unspecified: Secondary | ICD-10-CM | POA: Diagnosis not present

## 2023-12-17 DIAGNOSIS — F329 Major depressive disorder, single episode, unspecified: Secondary | ICD-10-CM | POA: Diagnosis not present

## 2023-12-22 DIAGNOSIS — F329 Major depressive disorder, single episode, unspecified: Secondary | ICD-10-CM | POA: Diagnosis not present

## 2023-12-24 DIAGNOSIS — F329 Major depressive disorder, single episode, unspecified: Secondary | ICD-10-CM | POA: Diagnosis not present

## 2023-12-29 DIAGNOSIS — F329 Major depressive disorder, single episode, unspecified: Secondary | ICD-10-CM | POA: Diagnosis not present

## 2024-01-07 DIAGNOSIS — F329 Major depressive disorder, single episode, unspecified: Secondary | ICD-10-CM | POA: Diagnosis not present

## 2024-01-12 DIAGNOSIS — F329 Major depressive disorder, single episode, unspecified: Secondary | ICD-10-CM | POA: Diagnosis not present

## 2024-01-13 DIAGNOSIS — F329 Major depressive disorder, single episode, unspecified: Secondary | ICD-10-CM | POA: Diagnosis not present

## 2024-01-14 DIAGNOSIS — F329 Major depressive disorder, single episode, unspecified: Secondary | ICD-10-CM | POA: Diagnosis not present

## 2024-01-19 DIAGNOSIS — F329 Major depressive disorder, single episode, unspecified: Secondary | ICD-10-CM | POA: Diagnosis not present

## 2024-01-21 DIAGNOSIS — F329 Major depressive disorder, single episode, unspecified: Secondary | ICD-10-CM | POA: Diagnosis not present

## 2024-01-26 DIAGNOSIS — F329 Major depressive disorder, single episode, unspecified: Secondary | ICD-10-CM | POA: Diagnosis not present

## 2024-01-27 DIAGNOSIS — F329 Major depressive disorder, single episode, unspecified: Secondary | ICD-10-CM | POA: Diagnosis not present

## 2024-01-28 DIAGNOSIS — F329 Major depressive disorder, single episode, unspecified: Secondary | ICD-10-CM | POA: Diagnosis not present

## 2024-02-02 DIAGNOSIS — F329 Major depressive disorder, single episode, unspecified: Secondary | ICD-10-CM | POA: Diagnosis not present

## 2024-02-04 DIAGNOSIS — F329 Major depressive disorder, single episode, unspecified: Secondary | ICD-10-CM | POA: Diagnosis not present

## 2024-02-11 DIAGNOSIS — F329 Major depressive disorder, single episode, unspecified: Secondary | ICD-10-CM | POA: Diagnosis not present

## 2024-02-16 DIAGNOSIS — F329 Major depressive disorder, single episode, unspecified: Secondary | ICD-10-CM | POA: Diagnosis not present

## 2024-02-18 DIAGNOSIS — F329 Major depressive disorder, single episode, unspecified: Secondary | ICD-10-CM | POA: Diagnosis not present

## 2024-02-20 DIAGNOSIS — F329 Major depressive disorder, single episode, unspecified: Secondary | ICD-10-CM | POA: Diagnosis not present

## 2024-02-23 DIAGNOSIS — F329 Major depressive disorder, single episode, unspecified: Secondary | ICD-10-CM | POA: Diagnosis not present

## 2024-02-25 DIAGNOSIS — F329 Major depressive disorder, single episode, unspecified: Secondary | ICD-10-CM | POA: Diagnosis not present

## 2024-02-27 DIAGNOSIS — F329 Major depressive disorder, single episode, unspecified: Secondary | ICD-10-CM | POA: Diagnosis not present

## 2024-03-01 DIAGNOSIS — F329 Major depressive disorder, single episode, unspecified: Secondary | ICD-10-CM | POA: Diagnosis not present

## 2024-03-03 DIAGNOSIS — F329 Major depressive disorder, single episode, unspecified: Secondary | ICD-10-CM | POA: Diagnosis not present

## 2024-03-05 DIAGNOSIS — F329 Major depressive disorder, single episode, unspecified: Secondary | ICD-10-CM | POA: Diagnosis not present

## 2024-03-08 DIAGNOSIS — F329 Major depressive disorder, single episode, unspecified: Secondary | ICD-10-CM | POA: Diagnosis not present

## 2024-03-10 DIAGNOSIS — F329 Major depressive disorder, single episode, unspecified: Secondary | ICD-10-CM | POA: Diagnosis not present

## 2024-03-17 DIAGNOSIS — F329 Major depressive disorder, single episode, unspecified: Secondary | ICD-10-CM | POA: Diagnosis not present

## 2024-03-22 DIAGNOSIS — F329 Major depressive disorder, single episode, unspecified: Secondary | ICD-10-CM | POA: Diagnosis not present

## 2024-03-24 DIAGNOSIS — F329 Major depressive disorder, single episode, unspecified: Secondary | ICD-10-CM | POA: Diagnosis not present

## 2024-03-26 DIAGNOSIS — F329 Major depressive disorder, single episode, unspecified: Secondary | ICD-10-CM | POA: Diagnosis not present

## 2024-03-29 DIAGNOSIS — F329 Major depressive disorder, single episode, unspecified: Secondary | ICD-10-CM | POA: Diagnosis not present

## 2024-03-31 DIAGNOSIS — F329 Major depressive disorder, single episode, unspecified: Secondary | ICD-10-CM | POA: Diagnosis not present

## 2024-04-02 DIAGNOSIS — F329 Major depressive disorder, single episode, unspecified: Secondary | ICD-10-CM | POA: Diagnosis not present

## 2024-04-05 DIAGNOSIS — F329 Major depressive disorder, single episode, unspecified: Secondary | ICD-10-CM | POA: Diagnosis not present

## 2024-04-07 DIAGNOSIS — F329 Major depressive disorder, single episode, unspecified: Secondary | ICD-10-CM | POA: Diagnosis not present

## 2024-04-09 DIAGNOSIS — F329 Major depressive disorder, single episode, unspecified: Secondary | ICD-10-CM | POA: Diagnosis not present

## 2024-04-12 DIAGNOSIS — F329 Major depressive disorder, single episode, unspecified: Secondary | ICD-10-CM | POA: Diagnosis not present

## 2024-04-14 DIAGNOSIS — F329 Major depressive disorder, single episode, unspecified: Secondary | ICD-10-CM | POA: Diagnosis not present

## 2024-04-16 DIAGNOSIS — F329 Major depressive disorder, single episode, unspecified: Secondary | ICD-10-CM | POA: Diagnosis not present

## 2024-04-19 DIAGNOSIS — F329 Major depressive disorder, single episode, unspecified: Secondary | ICD-10-CM | POA: Diagnosis not present

## 2024-04-21 DIAGNOSIS — F329 Major depressive disorder, single episode, unspecified: Secondary | ICD-10-CM | POA: Diagnosis not present

## 2024-04-26 DIAGNOSIS — F329 Major depressive disorder, single episode, unspecified: Secondary | ICD-10-CM | POA: Diagnosis not present

## 2024-04-28 DIAGNOSIS — F329 Major depressive disorder, single episode, unspecified: Secondary | ICD-10-CM | POA: Diagnosis not present

## 2024-05-03 DIAGNOSIS — F329 Major depressive disorder, single episode, unspecified: Secondary | ICD-10-CM | POA: Diagnosis not present

## 2024-05-07 DIAGNOSIS — F329 Major depressive disorder, single episode, unspecified: Secondary | ICD-10-CM | POA: Diagnosis not present

## 2024-05-10 DIAGNOSIS — F329 Major depressive disorder, single episode, unspecified: Secondary | ICD-10-CM | POA: Diagnosis not present

## 2024-05-12 DIAGNOSIS — F329 Major depressive disorder, single episode, unspecified: Secondary | ICD-10-CM | POA: Diagnosis not present

## 2024-05-14 DIAGNOSIS — F329 Major depressive disorder, single episode, unspecified: Secondary | ICD-10-CM | POA: Diagnosis not present

## 2024-05-15 DIAGNOSIS — R6884 Jaw pain: Secondary | ICD-10-CM | POA: Diagnosis not present

## 2024-05-17 DIAGNOSIS — F329 Major depressive disorder, single episode, unspecified: Secondary | ICD-10-CM | POA: Diagnosis not present

## 2024-05-17 DIAGNOSIS — Z7151 Drug abuse counseling and surveillance of drug abuser: Secondary | ICD-10-CM | POA: Diagnosis not present

## 2024-05-19 DIAGNOSIS — F329 Major depressive disorder, single episode, unspecified: Secondary | ICD-10-CM | POA: Diagnosis not present

## 2024-05-21 DIAGNOSIS — F329 Major depressive disorder, single episode, unspecified: Secondary | ICD-10-CM | POA: Diagnosis not present

## 2024-05-26 DIAGNOSIS — F329 Major depressive disorder, single episode, unspecified: Secondary | ICD-10-CM | POA: Diagnosis not present

## 2024-05-31 DIAGNOSIS — F329 Major depressive disorder, single episode, unspecified: Secondary | ICD-10-CM | POA: Diagnosis not present

## 2024-06-02 DIAGNOSIS — F329 Major depressive disorder, single episode, unspecified: Secondary | ICD-10-CM | POA: Diagnosis not present

## 2024-06-04 DIAGNOSIS — F329 Major depressive disorder, single episode, unspecified: Secondary | ICD-10-CM | POA: Diagnosis not present

## 2024-06-07 DIAGNOSIS — F329 Major depressive disorder, single episode, unspecified: Secondary | ICD-10-CM | POA: Diagnosis not present

## 2024-06-09 DIAGNOSIS — F329 Major depressive disorder, single episode, unspecified: Secondary | ICD-10-CM | POA: Diagnosis not present

## 2024-06-14 DIAGNOSIS — F329 Major depressive disorder, single episode, unspecified: Secondary | ICD-10-CM | POA: Diagnosis not present

## 2024-06-16 DIAGNOSIS — F329 Major depressive disorder, single episode, unspecified: Secondary | ICD-10-CM | POA: Diagnosis not present

## 2024-06-18 DIAGNOSIS — F329 Major depressive disorder, single episode, unspecified: Secondary | ICD-10-CM | POA: Diagnosis not present

## 2024-06-23 DIAGNOSIS — F329 Major depressive disorder, single episode, unspecified: Secondary | ICD-10-CM | POA: Diagnosis not present

## 2024-06-28 DIAGNOSIS — F329 Major depressive disorder, single episode, unspecified: Secondary | ICD-10-CM | POA: Diagnosis not present

## 2024-06-30 DIAGNOSIS — F329 Major depressive disorder, single episode, unspecified: Secondary | ICD-10-CM | POA: Diagnosis not present

## 2024-07-02 DIAGNOSIS — F329 Major depressive disorder, single episode, unspecified: Secondary | ICD-10-CM | POA: Diagnosis not present

## 2024-07-05 DIAGNOSIS — F329 Major depressive disorder, single episode, unspecified: Secondary | ICD-10-CM | POA: Diagnosis not present

## 2024-07-07 DIAGNOSIS — F329 Major depressive disorder, single episode, unspecified: Secondary | ICD-10-CM | POA: Diagnosis not present

## 2024-07-07 DIAGNOSIS — Z7151 Drug abuse counseling and surveillance of drug abuser: Secondary | ICD-10-CM | POA: Diagnosis not present

## 2024-07-09 DIAGNOSIS — F329 Major depressive disorder, single episode, unspecified: Secondary | ICD-10-CM | POA: Diagnosis not present

## 2024-07-12 DIAGNOSIS — F329 Major depressive disorder, single episode, unspecified: Secondary | ICD-10-CM | POA: Diagnosis not present

## 2024-07-14 DIAGNOSIS — F329 Major depressive disorder, single episode, unspecified: Secondary | ICD-10-CM | POA: Diagnosis not present

## 2024-07-19 DIAGNOSIS — F329 Major depressive disorder, single episode, unspecified: Secondary | ICD-10-CM | POA: Diagnosis not present

## 2024-07-21 DIAGNOSIS — F329 Major depressive disorder, single episode, unspecified: Secondary | ICD-10-CM | POA: Diagnosis not present

## 2024-07-30 DIAGNOSIS — F329 Major depressive disorder, single episode, unspecified: Secondary | ICD-10-CM | POA: Diagnosis not present

## 2024-08-04 DIAGNOSIS — F329 Major depressive disorder, single episode, unspecified: Secondary | ICD-10-CM | POA: Diagnosis not present

## 2024-08-09 DIAGNOSIS — F329 Major depressive disorder, single episode, unspecified: Secondary | ICD-10-CM | POA: Diagnosis not present

## 2024-08-11 DIAGNOSIS — F329 Major depressive disorder, single episode, unspecified: Secondary | ICD-10-CM | POA: Diagnosis not present

## 2024-08-16 DIAGNOSIS — F329 Major depressive disorder, single episode, unspecified: Secondary | ICD-10-CM | POA: Diagnosis not present

## 2024-08-18 DIAGNOSIS — F329 Major depressive disorder, single episode, unspecified: Secondary | ICD-10-CM | POA: Diagnosis not present

## 2024-08-19 DIAGNOSIS — F329 Major depressive disorder, single episode, unspecified: Secondary | ICD-10-CM | POA: Diagnosis not present

## 2024-08-23 DIAGNOSIS — F329 Major depressive disorder, single episode, unspecified: Secondary | ICD-10-CM | POA: Diagnosis not present

## 2024-08-30 DIAGNOSIS — F329 Major depressive disorder, single episode, unspecified: Secondary | ICD-10-CM | POA: Diagnosis not present

## 2024-09-01 DIAGNOSIS — F329 Major depressive disorder, single episode, unspecified: Secondary | ICD-10-CM | POA: Diagnosis not present

## 2024-09-03 DIAGNOSIS — F329 Major depressive disorder, single episode, unspecified: Secondary | ICD-10-CM | POA: Diagnosis not present

## 2024-09-06 DIAGNOSIS — F329 Major depressive disorder, single episode, unspecified: Secondary | ICD-10-CM | POA: Diagnosis not present

## 2024-09-08 DIAGNOSIS — F329 Major depressive disorder, single episode, unspecified: Secondary | ICD-10-CM | POA: Diagnosis not present

## 2024-09-10 DIAGNOSIS — F329 Major depressive disorder, single episode, unspecified: Secondary | ICD-10-CM | POA: Diagnosis not present

## 2024-09-13 DIAGNOSIS — F329 Major depressive disorder, single episode, unspecified: Secondary | ICD-10-CM | POA: Diagnosis not present

## 2024-09-15 DIAGNOSIS — F329 Major depressive disorder, single episode, unspecified: Secondary | ICD-10-CM | POA: Diagnosis not present

## 2024-09-20 DIAGNOSIS — F329 Major depressive disorder, single episode, unspecified: Secondary | ICD-10-CM | POA: Diagnosis not present

## 2024-09-22 DIAGNOSIS — F329 Major depressive disorder, single episode, unspecified: Secondary | ICD-10-CM | POA: Diagnosis not present

## 2024-09-24 DIAGNOSIS — F329 Major depressive disorder, single episode, unspecified: Secondary | ICD-10-CM | POA: Diagnosis not present

## 2024-09-27 DIAGNOSIS — F329 Major depressive disorder, single episode, unspecified: Secondary | ICD-10-CM | POA: Diagnosis not present

## 2024-09-29 DIAGNOSIS — F329 Major depressive disorder, single episode, unspecified: Secondary | ICD-10-CM | POA: Diagnosis not present

## 2024-10-04 DIAGNOSIS — F329 Major depressive disorder, single episode, unspecified: Secondary | ICD-10-CM | POA: Diagnosis not present

## 2024-10-06 DIAGNOSIS — F329 Major depressive disorder, single episode, unspecified: Secondary | ICD-10-CM | POA: Diagnosis not present

## 2024-10-11 DIAGNOSIS — F329 Major depressive disorder, single episode, unspecified: Secondary | ICD-10-CM | POA: Diagnosis not present
